# Patient Record
Sex: Female | Born: 1965 | Race: White | Hispanic: No | Marital: Married | Smoking: Former smoker
Health system: Southern US, Community
[De-identification: ages and names within clinical notes are randomized; demographics above are authoritative.]

## PROBLEM LIST (undated history)

## (undated) ENCOUNTER — Emergency Department (HOSPITAL_COMMUNITY): Admission: EM | Payer: Worker's Compensation

## (undated) DIAGNOSIS — E785 Hyperlipidemia, unspecified: Secondary | ICD-10-CM

## (undated) DIAGNOSIS — F419 Anxiety disorder, unspecified: Secondary | ICD-10-CM

## (undated) HISTORY — PX: ABDOMINAL HYSTERECTOMY: SHX81

## (undated) HISTORY — DX: Hyperlipidemia, unspecified: E78.5

## (undated) HISTORY — DX: Anxiety disorder, unspecified: F41.9

---

## 1998-10-11 ENCOUNTER — Emergency Department (HOSPITAL_COMMUNITY): Admission: EM | Admit: 1998-10-11 | Discharge: 1998-10-11 | Payer: Self-pay | Admitting: Emergency Medicine

## 2003-02-13 ENCOUNTER — Emergency Department (HOSPITAL_COMMUNITY): Admission: EM | Admit: 2003-02-13 | Discharge: 2003-02-13 | Payer: Self-pay | Admitting: Emergency Medicine

## 2003-03-08 HISTORY — PX: CARPAL TUNNEL RELEASE: SHX101

## 2003-06-20 ENCOUNTER — Encounter: Admission: RE | Admit: 2003-06-20 | Discharge: 2003-06-20 | Payer: Self-pay | Admitting: Family Medicine

## 2003-07-02 ENCOUNTER — Ambulatory Visit (HOSPITAL_COMMUNITY): Admission: RE | Admit: 2003-07-02 | Discharge: 2003-07-02 | Payer: Self-pay | Admitting: Obstetrics and Gynecology

## 2003-08-06 ENCOUNTER — Encounter (INDEPENDENT_AMBULATORY_CARE_PROVIDER_SITE_OTHER): Payer: Self-pay | Admitting: *Deleted

## 2003-08-07 ENCOUNTER — Inpatient Hospital Stay (HOSPITAL_COMMUNITY): Admission: RE | Admit: 2003-08-07 | Discharge: 2003-08-09 | Payer: Self-pay | Admitting: Obstetrics and Gynecology

## 2006-08-01 ENCOUNTER — Ambulatory Visit: Payer: Self-pay | Admitting: *Deleted

## 2006-08-01 ENCOUNTER — Inpatient Hospital Stay (HOSPITAL_COMMUNITY): Admission: RE | Admit: 2006-08-01 | Discharge: 2006-08-05 | Payer: Self-pay | Admitting: *Deleted

## 2007-08-06 ENCOUNTER — Encounter: Admission: RE | Admit: 2007-08-06 | Discharge: 2007-08-06 | Payer: Self-pay | Admitting: Family Medicine

## 2007-11-29 ENCOUNTER — Ambulatory Visit (HOSPITAL_COMMUNITY): Payer: Self-pay | Admitting: Psychiatry

## 2007-12-05 ENCOUNTER — Ambulatory Visit (HOSPITAL_COMMUNITY): Payer: Self-pay | Admitting: Psychiatry

## 2007-12-06 ENCOUNTER — Ambulatory Visit (HOSPITAL_COMMUNITY): Payer: Self-pay | Admitting: Psychiatry

## 2007-12-18 ENCOUNTER — Ambulatory Visit (HOSPITAL_COMMUNITY): Payer: Self-pay | Admitting: Psychiatry

## 2008-01-01 ENCOUNTER — Ambulatory Visit (HOSPITAL_COMMUNITY): Payer: Self-pay | Admitting: Psychiatry

## 2008-01-08 ENCOUNTER — Ambulatory Visit (HOSPITAL_COMMUNITY): Payer: Self-pay | Admitting: Psychiatry

## 2008-01-29 ENCOUNTER — Ambulatory Visit (HOSPITAL_COMMUNITY): Payer: Self-pay | Admitting: Psychiatry

## 2008-04-01 ENCOUNTER — Ambulatory Visit (HOSPITAL_COMMUNITY): Payer: Self-pay | Admitting: Psychiatry

## 2008-06-24 ENCOUNTER — Ambulatory Visit (HOSPITAL_COMMUNITY): Payer: Self-pay | Admitting: Psychiatry

## 2008-09-23 ENCOUNTER — Ambulatory Visit (HOSPITAL_COMMUNITY): Payer: Self-pay | Admitting: Psychiatry

## 2009-01-27 ENCOUNTER — Ambulatory Visit (HOSPITAL_COMMUNITY): Payer: Self-pay | Admitting: Psychiatry

## 2009-04-21 ENCOUNTER — Ambulatory Visit (HOSPITAL_COMMUNITY): Payer: Self-pay | Admitting: Psychiatry

## 2009-07-14 ENCOUNTER — Ambulatory Visit (HOSPITAL_COMMUNITY): Payer: Self-pay | Admitting: Psychiatry

## 2009-10-13 ENCOUNTER — Ambulatory Visit (HOSPITAL_COMMUNITY): Payer: Self-pay | Admitting: Psychiatry

## 2009-11-17 ENCOUNTER — Ambulatory Visit: Payer: Self-pay | Admitting: Family Medicine

## 2009-11-17 ENCOUNTER — Encounter: Payer: Self-pay | Admitting: Physician Assistant

## 2009-11-17 DIAGNOSIS — F411 Generalized anxiety disorder: Secondary | ICD-10-CM | POA: Insufficient documentation

## 2009-11-17 LAB — CONVERTED CEMR LAB: OCCULT 1: NEGATIVE

## 2009-11-19 LAB — CONVERTED CEMR LAB
ALT: 25 units/L (ref 0–35)
BUN: 9 mg/dL (ref 6–23)
Chloride: 104 meq/L (ref 96–112)
Cholesterol: 241 mg/dL — ABNORMAL HIGH (ref 0–200)
Glucose, Bld: 79 mg/dL (ref 70–99)
MCV: 94.8 fL (ref 78.0–100.0)
Potassium: 4 meq/L (ref 3.5–5.3)
TSH: 0.571 microintl units/mL (ref 0.350–4.500)
Total Bilirubin: 0.2 mg/dL — ABNORMAL LOW (ref 0.3–1.2)
Total CHOL/HDL Ratio: 4.1
Total Protein: 6.8 g/dL (ref 6.0–8.3)

## 2009-11-23 ENCOUNTER — Ambulatory Visit (HOSPITAL_COMMUNITY)
Admission: RE | Admit: 2009-11-23 | Discharge: 2009-11-23 | Payer: Self-pay | Source: Home / Self Care | Admitting: Family Medicine

## 2009-11-25 ENCOUNTER — Encounter: Payer: Self-pay | Admitting: Physician Assistant

## 2009-12-22 ENCOUNTER — Ambulatory Visit (HOSPITAL_COMMUNITY): Payer: Self-pay | Admitting: Psychiatry

## 2010-03-16 ENCOUNTER — Ambulatory Visit (HOSPITAL_COMMUNITY)
Admission: RE | Admit: 2010-03-16 | Discharge: 2010-03-16 | Payer: Self-pay | Source: Home / Self Care | Attending: Psychiatry | Admitting: Psychiatry

## 2010-03-17 ENCOUNTER — Ambulatory Visit (HOSPITAL_COMMUNITY): Admission: RE | Admit: 2010-03-17 | Payer: Self-pay | Source: Home / Self Care | Admitting: Podiatry

## 2010-03-28 ENCOUNTER — Encounter: Payer: Self-pay | Admitting: Family Medicine

## 2010-04-06 NOTE — Letter (Signed)
Summary: [facility]  [facility]   Imported By: Lind Guest 11/25/2009 13:00:47  _____________________________________________________________________  External Attachment:    Type:   Image     Comment:   External Document

## 2010-04-06 NOTE — Assessment & Plan Note (Signed)
Summary: new pt   Vital Signs:  Patient profile:   45 year old female Height:      62 inches Weight:      166.25 pounds BMI:     30.52 O2 Sat:      98 % on Room air Pulse rate:   97 / minute BP sitting:   124 / 72  (right arm)  Nutrition Counseling: Patient's BMI is greater than 25 and therefore counseled on weight management options. CC: here today to establish care with Physician,concerned about weight, she put on about 30 lbs after she quit smoking, she has lost 5 lbs. She also states she has concerns about her big right toe, she can't wear hills. room 1 Is Patient Diabetic? No   CC:  here today to establish care with Physician, concerned about weight, she put on about 30 lbs after she quit smoking, she has lost 5 lbs. She also states she has concerns about her big right toe, and she can't wear hills. room 1.  History of Present Illness: New pt here to establish care with new PCP and needs physical. Last physical 2 yrs ago.  Last mamm 2 yrs ago, and labs also. Last Td 2 yrs ago. Last eye exam 1 yr ago. Dentist care every 6 mos.  See's Behavioral health for anxiety.  Is doing well.   Past History:  Past medical, surgical, family and social histories (including risk factors) reviewed, and no changes noted (except as noted below).  Past Medical History: Anxiety  Past Surgical History: Hysterectomy - due to endometriosis, 3/91 bilat oophorectomy - 2003 Carpal tunnel bilat - 4/05  Family History: Reviewed history and no changes required. Mother - Anxiety Father - DM 1 sister - anxiety 2 brothers - anxiety Mat Grandmother - Breast CA Mat Aunt - Breast CA Engineer, drilling & grandfather - DM  Social History: Reviewed history and no changes required. Occupation:  Production designer, theatre/television/film at [employer] Married Alcohol use-yes Drug use-no Regular exercise-yes, x 1 mos Quit smoking 9/10 Occupation:  employed Drug Use:  no Does Patient Exercise:  yes  Review of Systems General:   Denies chills, fatigue, fever, and loss of appetite. Eyes:  Denies blurring and double vision. ENT:  Denies decreased hearing, earache, nasal congestion, postnasal drainage, ringing in ears, sinus pressure, and sore throat. CV:  Denies chest pain or discomfort and palpitations. Resp:  Denies cough and shortness of breath. GI:  Complains of indigestion; denies abdominal pain, bloody stools, change in bowel habits, constipation, diarrhea, loss of appetite, nausea, and vomiting; INDIGESTION WITH CERTAIN FOODS ONLY. GU:  Denies abnormal vaginal bleeding, dysuria, incontinence, and urinary frequency. MS:  Complains of joint pain; RT 1ST MTP X APPROX 1 YR.. Derm:  Denies rash. Neuro:  Denies headaches, numbness, and tingling. Psych:  Complains of anxiety; denies depression, suicidal thoughts/plans, and thoughts /plans of harming others. Allergy:  Complains of seasonal allergies.  Physical Exam  General:  Well-developed,well-nourished,in no acute distress; alert,appropriate and cooperative throughout examination Head:  Normocephalic and atraumatic without obvious abnormalities. No apparent alopecia or balding. Ears:  External ear exam shows no significant lesions or deformities.  Otoscopic examination reveals clear canals, tympanic membranes are intact bilaterally without bulging, retraction, inflammation or discharge. Hearing is grossly normal bilaterally. Nose:  External nasal examination shows no deformity or inflammation. Nasal mucosa are pink and moist without lesions or exudates. Mouth:  Oral mucosa and oropharynx without lesions or exudates.  Teeth in good repair. Neck:  No deformities,  masses, or tenderness noted. Breasts:  No mass, nodules, thickening, tenderness, bulging, retraction, inflamation, nipple discharge or skin changes noted.   Lungs:  Normal respiratory effort, chest expands symmetrically. Lungs are clear to auscultation, no crackles or wheezes. Heart:  Normal rate and regular  rhythm. S1 and S2 normal without gallop, murmur, click, rub or other extra sounds. Abdomen:  Bowel sounds positive,abdomen soft and non-tender without masses, organomegaly or hernias noted. Rectal:  No external abnormalities noted. Normal sphincter tone. No rectal masses or tenderness. Genitalia:  normal introitus, no external lesions, no vaginal discharge, mucosa pink and moist, and no adnexal masses or tenderness.  Uterus and cervix absent. Pulses:  R posterior tibial normal, R dorsalis pedis normal, L posterior tibial normal, and L dorsalis pedis normal.   Extremities:  No clubbing, cyanosis, edema, or deformity noted with normal full range of motion of all joints.   Neurologic:  alert & oriented X3, sensation intact to light touch, gait normal, and DTRs symmetrical and normal.   Skin:  Intact without suspicious lesions or rashes Cervical Nodes:  No lymphadenopathy noted Axillary Nodes:  No palpable lymphadenopathy Psych:  Cognition and judgment appear intact. Alert and cooperative with normal attention span and concentration. No apparent delusions, illusions, hallucinations   Impression & Recommendations:  Problem # 1:  Preventive Health Care (ICD-V70.0) Assessment Comment Only  Problem # 2:  ANXIETY (ICD-300.00) Assessment: Comment Only  Complete Medication List: 1)  Ativan 0.5mg   .... One tablet as needed 2)  Amitriptylin (elavil) 75mg   .... 1 pill at bedtime 3)  Multi Vitamin  .Marland Kitchen.. 1 pill a day  Other Orders: Mammogram (Screening) (Mammo) Podiatry Referral (Podiatry) T-Comprehensive Metabolic Panel [id number]) T-Lipid Profile [id number]) T-CBC No Diff ([id number]) T-TSH [phone]) Influenza Vaccine NON MCR (02725)  Patient Instructions: 1)  Please schedule a follow-up appointment in 6 months. 2)  You should take a multivit without iron daily.  Such as Centrum silver, or comparable.   3)  You should also get 1200 mg of Calcium a day.  This can be from dietary  intake or calcium supplements. 4)  It is important that you exercise regularly at least 20 minutes 5 times a week. If you develop chest pain, have severe difficulty breathing, or feel very tired , stop exercising immediately and seek medical attention. 5)  You need to lose weight. Consider a lower calorie diet and regular exercise.    Influenza Vaccine    Vaccine Type: Fluvax Non-MCR    Site: left deltoid    Mfr: novartis    Dose: 0.5 ml    Route: IM    Given by: Adella Hare LPN    Exp. Date: 05/12    Lot #: [id number]    VIS given: 09/29/09 version given November 17, 2009.    Orders Added: 1)  Mammogram (Screening) [Mammo] 2)  Podiatry Referral [Podiatry] 3)  T-Comprehensive Metabolic Panel [[id number]] 4)  T-Lipid Profile [[id number]] 5)  T-CBC No Diff [[id number]] 6)  T-TSH [[id number]] 7)  Influenza Vaccine NON MCR [00028]  Laboratory Results    Stool - Occult Blood Hemmoccult #1: negative Date: 11/17/2009

## 2010-07-20 NOTE — H&P (Signed)
NAMEALIYA, SOL NO.:  [id number]   MEDICAL RECORD NO.:  [id number]            PATIENT TYPE:   LOCATION:                                 FACILITY:   PHYSICIAN:  Ashley [name], M.D.           DATE OF BIRTH:   DATE OF ADMISSION:  08/01/2006  DATE OF DISCHARGE:                       PSYCHIATRIC ADMISSION ASSESSMENT   Ashley 45 year old married white [name] voluntarily admitted on Aug 01, 2006.   HISTORY OF PRESENT ILLNESS:  The patient presents with Ashley history of  anxiety.  Has been taking Xanax up to 6 mg Ashley day.  Reports Ashley history of  anxiety and panic.  She provides an example that she has trouble driving  and, because of her anxiety, she always has to drive on the right side  of the road in case she needs to exit.  She reports that she has been  taking more and more Xanax.  She has had significant side effects to  antidepressants in the past.  She reports periods of lost memory.  She  has asked her M.D. for advice and was sent here to be detoxed.  She  denies any suicidal ideation.  She denies any specific stressors.  She  reports she has lost about 40 pounds.   PAST PSYCHIATRIC HISTORY:  First admission to [facility].  She has been on Cymbalta in the past and had suicidal thoughts.   SOCIAL HISTORY:  This is Ashley [name], second marriage,  married for 7 years.  Has two adult children, 42 and 20, lives with her  husband.  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  Prior  to that, worked as Ashley Designer, industrial/product at [employer].   FAMILY HISTORY:  Her mother is currently on Xanax.   ALCOHOL AND DRUG HISTORY:  The patient smokes.  Denies any alcohol or  drug use.   PRIMARY CARE Ashley [name]:  Dr. Marny Lowenstein at [facility].   MEDICAL PROBLEMS:  Denies any acute or chronic health issues.   MEDICATIONS:  Has been on Estrace and Xanax for 10 years.   DRUG ALLERGIES:  No known allergies.   PAST SURGICAL HISTORY:  The  patient had Ashley hysterectomy in 1991 and had  bilateral carpal tunnel surgery in 2004.   REVIEW OF SYSTEMS:  No fever, no chills.  No chest pain, no shortness of  breath.  No nausea, no vomiting.  No dysuria.  Complaining of some left  elbow pain with some numbness.  History of headaches.  No seizures.  Temperature is 97.5, 73 heart rate, 14 respirations, blood pressure  106/62.   PHYSICAL EXAM:  This is Ashley well-developed, well-nourished [name] in no  acute distress.  She is alert and cooperative.  NECK:  No __________  masses.  Negative lymphadenopathy.  Trachea is midline.  No thyroid  enlargement.  CHEST:  Is clear, no crackles, no wheezes.  BREAST EXAM:  Deferred.  HEART:  Regular rate and rhythm, S1 and S2 auscultated.  No murmurs, no  gallops, no rubs.  ABDOMEN:  Is soft, no tenderness to palpation.  Bowel  sounds auscultated x4.  GU EXAM:  Deferred.  EXTREMITIES:  The patient moves all extremities, no edema, no clubbing.  Full range of motion, 5+ against resistance.  Peripheral pulses intact.  SKIN:  No signs of self injury, no rashes, no bruising.  NEUROLOGICAL FINDINGS:  The patient is alert and oriented.  Cranial  nerves are intact.  Sensation intact.  Negative Romberg.  Normal gait.  Reflexes intact.  Performs rapid alternating movements without  difficulty.  Urine drug screen is positive for benzodiazepines.  The  CMET was within normal limits.  Urinalysis is negative.  The TSH is  0.928, RBC is 3.85, RDW is 15.3.   MENTAL STATUS EXAM:  She is Ashley fully alert, cooperative [name].  Speech  is clear, normal rate and tone.  The patient's mood is somewhat anxious.  The patient is friendly, pleasant.  Thought processes are coherent.  No  evidence of psychosis, cognition and function intact.  Memory is good.  Judgment and insight are good.   AXIS I:  Anxiety disorder, benzo dependence.  AXIS II:  Deferred.  AXIS III:  No acute or chronic health issues.  Does report Ashley history  of  headaches.  AXIS IV:  Is deferred.  AXIS V:  Current is 45.   PLANS:  To contract for safety.  Stabilize mood and thinking.  We  discussed detox protocol with Librium.  The patient was agreeable to  discontinuing her Xanax and start the Librium.  Discussed Ashley family  session with her husband for support.  The patient does report the  patient's husband is very supportive.  The patient to follow up with Dr.  Dorothe Pea as needed and will discuss any other medications to help with  complaints of anxiety.  Her tentative length of stay is 4-5 days.      Ashley [name], N.P.      Ashley [name], M.D.  Electronically Signed    JO/MEDQ  D:  08/04/2006  T:  08/04/2006  Job:  [id number]

## 2010-07-23 NOTE — Op Note (Signed)
NAME:  Ashley [name], Ashley [name]                            ACCOUNT NO.:  [id number]   MEDICAL RECORD NO.:  [id number]                   PATIENT TYPE:  INP   LOCATION:  9306                                 FACILITY:  [facility]   PHYSICIAN:  Rudy Jew. Ashley Royalty, M.D.             DATE OF BIRTH:  04-24-1965   DATE OF PROCEDURE:  08/06/2003  DATE OF DISCHARGE:  08/09/2003                                 OPERATIVE REPORT   PREOPERATIVE DIAGNOSES:  1. Status post hysterectomy.  2. Pelvic pain -- debilitating.   POSTOPERATIVE DIAGNOSES:  1. Left ovarian cyst.  2. Pelvic adhesions.   PROCEDURE:  1. Diagnostic laparoscopy.  2. Exploratory laparotomy with lysis of adhesions.  3. Bilateral salpingo-oophorectomy.   SURGEON:  Rudy Jew. Ashley Royalty, M.D.   ANESTHESIA:  General.   ESTIMATED BLOOD LOSS:  Less than 50 cc.   COMPLICATIONS:  None.   PACKS AND DRAINS:  None.   DESCRIPTION OF PROCEDURE:  The patient was taken to the operating room and  placed in the dorsal supine position.  After adequate general anesthesia was  administered, she was placed in the lithotomy position and prepped and  draped in the usual manner for abdominal and vaginal surgery.  A Foley  catheter was placed.  A 1.5 cm longitudinal incision was made in the  inferior aspect of the umbilicus.  Then size 10-11 disposable laparoscopic  trocar was placed into the abdominal cavity; the location was verified by  placement of the laparoscope.  There was no evidence of any trauma.  CO2 was  used to create a pneumoperitoneum, which was maintained throughout the  procedure.   Next, a 5 mm suprapubic trocar was placed in the left lower quadrant.  Transillumination and direct visualization techniques were employed.  At  this point the patient was placed in Trendelenburg and an attempt was made  to visualize the pelvis.  The uterus was surgically absent.  The right ovary  was stuck to the pelvic sidewall.  It appeared to be otherwise normal  size,  shape and contour.  The left ovary was not well visualized due to colonic  adhesions, which obscured adequate visualization.   The patient stated an adamant desire preoperatively for bilateral salpingo-  oophorectomy.  Due to the right ovary being bound to the pelvic sidewall and  the left ovary obscured by adhesions, it was obvious the procedure would not  be feasible laparoscopically; hence, the decision was made to abandon  laparoscopy and proceed with laparotomy.   The abdominal instruments were removed and pneumoperitoneum evacuated.  The  skin on the left suprapubic was closed with 3-0 Monocryl in a subcuticular  fashion.  Thereafter the operator changed gloves and proceeded with  laparotomy.  The umbilical incision was simply extended inferiorly to create  the laparotomy incision.  The subcutaneous tissues were sharply dissected  down to the fascia, which was nicked with the  knife and incised  longitudinally with Mayo scissors.  The midline was identified and the  rectus muscles separated in the midline, exposing the peritoneum which was  elevated with hemostats and entered atraumatically with  Metzenbaum  scissors.  The laparoscopy incision was incorporated into the laparotomy  incision.   At this point the previously mentioned adhesions were encountered, and using  meticulous dissection and careful attention to avoid any injury to the GI  tract, the adhesions were taken down and the pelvis brought into view.  A  Balfour retractor was placed.  The upper abdomen was then packed off.  Using  sharp and blunt dissection, the ovaries were released from the pelvic  sidewall.  The remainder of the peritoneal surfaces were smooth and  glistening.  The cul-de-sac was clean.  The left ovary contained a 2-3 cm  cyst.  The right ovary appeared to be normal size, shape and contour (as  mentioned above).   The left broad ligament was opened.  The peritoneum was extended superiorly   above the left ovary.  The ureter was noted to be below the plain of  dissection on the medial leaf of the broad ligament.  The infundibulopelvic  ligament was isolated, clamped, cut and secured with #1 chromic catgut.  The  left ovary and tube were then excised and submitted to the pathology for  histologic studies.   The right broad ligament was opened.  The visceroperitoneum was opened  superiorly to the right ovary.  The ureter was identified well below the  plain of dissection on the medial leaf of the broad ligament.  The right  infundibulopelvic ligament was isolated, clamped, cut and secured with #1  chromic catgut.  The right fallopian tube and ovary were then excised and  submitted to pathology for histologic studies.   Hemostasis was noted.  The appendix was seen and felt to be normal.  At this  point the patient was felt to have benefited maximally from the surgical  procedure.  The abdominal  packs were removed.  The Balfour retractor was  removed.  The incision was then closed with a #1 PDS, using a modified Smead-  Jones technique.  The skin was closed with staples.   At the conclusion of the procedure, the urine was clear and copious.  The  patient was then taken to the recovery room in excellent condition.                                               James A. Ashley Royalty, M.D.    JAM/MEDQ  D:  08/10/2003  T:  08/11/2003  Job:  [id number]

## 2010-07-23 NOTE — H&P (Signed)
NAME:  Ashley [name], Ashley [name]                            ACCOUNT NO.:  [id number]   MEDICAL RECORD NO.:  [id number]                   PATIENT TYPE:  AMB   LOCATION:  [facility]                                  FACILITY:  [facility]   PHYSICIAN:  James A. Ashley Royalty, M.D.             DATE OF BIRTH:  02/07/1966   DATE OF ADMISSION:  08/06/2003  DATE OF DISCHARGE:                                HISTORY & PHYSICAL   This is a 45 year old gravida 2, para 2 referred to me for evaluation of  pelvic discomfort and recently diagnosed ovarian cysts.  I saw the patient  for the first time June 25, 2003, with the aforementioned complaints.  An  ultrasound was ordered by her primary care physician which revealed a  hemorrhagic right ovarian cyst and a small ovarian cyst on the left side.  She is status post hysterectomy.  Subsequently, a CT scan was obtained of  the abdomen and pelvis.  It returned as negative for any masses.  The  patient continued to have quite debilitating pelvic pain, particularly on  the right side.  She requested surgical intervention.  We discussed the  availability of laparoscopy and the patient requested same.  She returned  Aug 01, 2003, for preop consultation, at that time stated her pain was still  a 6 on a scale from 1 to 10.  She specifically requested removal of both  ovaries and fallopian tubes.   MEDICATIONS:  Xanax, Lexapro, oxycodone, buspirone.   PAST MEDICAL HISTORY:  Medical anxiety.   SURGICAL:  1. Ortho - carpal tunnel.  2. Hysterectomy March, 1991.   ALLERGIES:  NONE.   FAMILY HISTORY:  Is positive for hypertension, diabetes, breast cancer and  ovarian cancer.   SOCIAL HISTORY:  Patient is a smoker.  She also drinks alcohol.   REVIEW OF SYSTEMS:  Noncontributory.   PHYSICAL EXAMINATION:  A well-developed, well-nourished, pleasant female in  no acute distress.  Afebrile.  VITAL SIGNS:  Stable.  SKIN:  Warm and dry without lesions.  LYMPH:  There is no supraclavicular,  cervical or inguinal adenopathy.  HEENT:  Normocephalic.  NECK:  Supple without thyromegaly.  CHEST/LUNGS:  Clear.  CARDIAC:  Regular rate and rhythm without murmurs, gallops or rubs.  BREAST EXAM:  Deferred.  ABDOMEN:  Soft and nontender without masses or organomegaly.  Bowel sounds  were active.  MUSCULOSKELETAL EXAMINATION:  Reveals full range of motion without edema,  cyanosis or CVA tenderness.  PELVIC EXAMINATION (Aug 01, 2003):  __________ within normal limits.  Vagina  was without gross lesions.  Vaginal cuff was well healed.  Bimanual  examination reveals surgical absence of the uterus.  I appreciate no adnexal  masses.   IMPRESSION:  1. Status post hysterectomy.  2. Pelvic pain - debilitating - differential includes adhesions,     endometriosis.   PLAN:  Diagnostic/operative laparoscopy with bilateral salpingo-  oophorectomy.  Risks, benefits, complications and alternatives were fully  discussed with the patient.  She understands __________ bilateral salpingo-  oophorectomy, she would become a candidate for hormone replacement therapy.  She also understands the possibility of an exploratory laparotomy and agrees  to same.  Questions invited and answered.                                               James A. Ashley Royalty, M.D.    JAM/MEDQ  D:  08/06/2003  T:  08/06/2003  Job:  [id number]

## 2010-07-23 NOTE — Discharge Summary (Signed)
NAMECASSIDI, MODESITT NO.:  [id number]   MEDICAL RECORD NO.:  [id number]          PATIENT TYPE:  IPS   LOCATION:  0303                          FACILITY:  [facility]   PHYSICIAN:  Geoffery Lyons, M.D.      DATE OF BIRTH:  1965/05/24   DATE OF ADMISSION:  08/01/2006  DATE OF DISCHARGE:  08/05/2006                               DISCHARGE SUMMARY   BRIEF HISTORY AND PHYSICAL:  This was the first admission to [facility] for this 45 year old married white female voluntarily  admitted.  History of anxiety on increased amount of Xanax of up to 6 mg  per day, panic, trouble driving.  Has to drive on the right side in case  she needs to exit.  Taking more and more Xanax.  Claims side effects to  antidepressants, had periods of lost memory.   PAST PSYCHIATRIC HISTORY:  First time at Behavior Health.  In the past  has been on Cymbalta with previous some suicidal thoughts.  Had been on  Lexapro and other antidepressants.   SOCIAL HISTORY:  No active alcohol or any other substance use.   MEDICAL HISTORY:  Noncontributory.   MEDICATIONS:  Estrace, Xanax for 10 years.   PHYSICAL EXAMINATION:  Performed and failed to show any acute findings   LABORATORY WORK:  Not available in the chart.   MENTAL STATUS EXAM:  Reveals a fully alert, cooperative female.  Mood  anxious.  Affect anxious.  Speech clear, normal rate, tempo and  production.  Thought processes logical, coherent and relevant.  Endorsed  no active suicidal or homicidal ideations.  No evidence of delusions.  Cognition well-preserved.   DIAGNOSES:  AXIS I:  Anxiety disorder, not otherwise specified.  Benzodiazepine dependence.  AXIS II:  No diagnosis.  AXIS III:  No diagnosis.  AXIS IV:  Moderate.  AXIS V:  GAF on admission 45, GAF highest in the last year 70.   COURSE IN THE HOSPITAL:  She was admitted.  She was started on  individual and group psychotherapy.  As already stated this 41-year  female  has a history of depression, anxiety.  Has been on  antidepressants at the present with poor results.  She is taking Xanax  for anxiety for the past 10 years.  She developed suicidal ideas,  flushed her medications and has had no Xanax for 3 days.  She went to  [facility] for a 3-day medical admit, was not detoxed.  She was  discharged on Xanax.  As she opened up she endorsed that she had been  basically masking the feelings that she had been having.  Endorsed that  she had felt guilty when she left the husband when she left him due to  the affect that it could have on the children who were in junior and  senior high school.  She says that the children are doing very well and  she has no reason why to believe that they were affected but she had  been holding to this for all these years.  She endorsed  that she had  been helped to challenge her perception of things. She has coped through  the years by increasing the amount of Xanax, initially prescribed up to  6 mg a day.  She has been using more than 10.  She quit on her own, had  a seizure on the third day.  Placed back on Xanax. Family session  scheduled for May 31.  On May 30 she continued to detox, was able to  talk to her children and says that they could not validate her concerns,  her guilt for doing what she did by leaving the husband.  The children  validated that they never felt like the way she imagined that they felt,  that they are both doing really well.  We pursued the detox.  We pursued  ways of dealing with the anxiety.  The family session with the husband  went well.  There was __________ support.  The husband endorsed that it  was the best he has seen her for all this year.  There were no suicidal  thoughts.  She was in full contact with reality, no active withdrawal.  Therefore, we went ahead and discharged to outpatient follow-up.   DISCHARGE DIAGNOSES:  AXIS I:  Anxiety disorder, not otherwise  specified.   Benzodiazepine dependence.  AXIS II:  No diagnosis.  AXIS III:  No diagnosis.  AXIS IV:  Moderate.  AXIS V:  GAF upon discharge 55-60.   DISCHARGE MEDICATIONS:  Discharged on Estrace, no psychotropics.   FOLLOW UP:  Follow up with Fransisco Hertz.           ______________________________  Geoffery Lyons, M.D.     IL/MEDQ  D:  08/23/2006  T:  08/24/2006  Job:  [id number]

## 2010-07-23 NOTE — Discharge Summary (Signed)
NAME:  Ashley [name], Ashley [name]                            ACCOUNT NO.:  [id number]   MEDICAL RECORD NO.:  [id number]                   PATIENT TYPE:  INP   LOCATION:  9306                                 FACILITY:  [facility]   PHYSICIAN:  Rudy Jew. Ashley Royalty, M.D.             DATE OF BIRTH:  Feb 11, 1966   DATE OF ADMISSION:  08/06/2003  DATE OF DISCHARGE:  08/09/2003                                 DISCHARGE SUMMARY   DISCHARGE DIAGNOSES:  1. Status post total abdominal hysterectomy.  2. Pelvic pain.  3. Adhesions.     a. Omentum-right pelvic sidewall.     b. Colonic-colonic.     c. Colonic-left pelvic sidewall and left ovary.   OPERATIONS AND SPECIAL PROCEDURES:  1. Diagnostic laparoscopy.  2. Exploratory laparotomy with bilateral salpingo-oophorectomy and lysis of     adhesions.   CONSULTATIONS:  None.   DISCHARGE MEDICATIONS:  1. Estrace 1 mg daily.  2. Percocet.   HISTORY AND PHYSICAL:  This is a 45 year old gravida 2 para 2 referred to me  for evaluation of pelvic discomfort and recently diagnosed ovarian cyst.  An  ultrasound was ordered by her primary care physician which revealed a  hemorrhagic right ovarian cyst and a small ovarian cyst on the left side.  Subsequently a CT scan was obtained of the abdomen and pelvis.  It returned  as negative for any masses.  The patient continued to have quite  debilitating pelvic discomfort, particularly on the right side.  She  requested surgical intervention and, specifically, a bilateral salpingo-  oophorectomy.  For the remainder of the history and physical please see  chart.   HOSPITAL COURSE:  The patient was brought to the hospital August 06, 2003 for a  diagnostic laparoscopy.  She was taken to the operating room on that day and  diagnostic laparoscopy was performed.  During the procedure it was noted  that the right ovary was stuck to the pelvic sidewall.  The left ovary was  not well visualized due to colonic adhesions.  It was quickly  obvious to the  operator that the procedure could not be satisfactorily performed  laparoscopically.  Hence, the decision was made to convert to laparotomy.  This was accomplished without difficulty.  Lysis of adhesions was  accomplished.  Finally, the ovaries were both visualized and a bilateral  salpingo-oophorectomy was successfully performed.  The procedure was  uncomplicated.  The patient's postoperative course was complicated by  transient hypotension which was asymptomatic.  Urine culture was obtained.  The remainder of the postoperative course was benign.  The patient was  discharged home on August 09, 2003 afebrile and in satisfactory condition.   ACCESSORY CLINICAL FINDINGS:  Hemoglobin and hematocrit on admission were  11.4 and 34.4. respectively.  Repeat values obtained August 07, 2003 were 10.7  and 31.8 respectively.  White blood count was normal throughout.  Urine  culture revealed  no growth as of the time of this dictation.   DISPOSITION:  The patient is to return to [facility]  in 4-6 weeks for postoperative evaluation.  She is also to return August 13, 2003 to have the remainder of the surgical clips removed.                                               James A. Ashley Royalty, M.D.    JAM/MEDQ  D:  08/27/2003  T:  08/29/2003  Job:  [id number]

## 2010-09-07 ENCOUNTER — Encounter (HOSPITAL_COMMUNITY): Payer: Self-pay | Admitting: Psychiatry

## 2010-11-26 ENCOUNTER — Ambulatory Visit (INDEPENDENT_AMBULATORY_CARE_PROVIDER_SITE_OTHER): Payer: BC Managed Care – PPO | Admitting: Family Medicine

## 2010-11-26 ENCOUNTER — Encounter: Payer: Self-pay | Admitting: Family Medicine

## 2010-11-26 VITALS — BP 102/74 | HR 83 | Resp 16 | Ht 62.0 in | Wt 174.4 lb

## 2010-11-26 DIAGNOSIS — Z23 Encounter for immunization: Secondary | ICD-10-CM

## 2010-11-26 DIAGNOSIS — Z0000 Encounter for general adult medical examination without abnormal findings: Secondary | ICD-10-CM

## 2010-11-26 DIAGNOSIS — E669 Obesity, unspecified: Secondary | ICD-10-CM

## 2010-11-26 DIAGNOSIS — F411 Generalized anxiety disorder: Secondary | ICD-10-CM

## 2010-11-26 DIAGNOSIS — Z1231 Encounter for screening mammogram for malignant neoplasm of breast: Secondary | ICD-10-CM

## 2010-11-26 LAB — BASIC METABOLIC PANEL
Creat: 0.8 mg/dL (ref 0.50–1.10)
Sodium: 140 mEq/L (ref 135–145)

## 2010-11-26 LAB — LIPID PANEL
Cholesterol: 249 mg/dL — ABNORMAL HIGH (ref 0–200)
Total CHOL/HDL Ratio: 3.5 Ratio
Triglycerides: 88 mg/dL (ref <150)

## 2010-11-26 LAB — TSH: TSH: 1.1 u[IU]/mL (ref 0.350–4.500)

## 2010-11-26 MED ORDER — LORAZEPAM 0.5 MG PO TABS: 0.5000 mg | ORAL_TABLET | ORAL | Status: DC | PRN

## 2010-11-26 MED ORDER — AMITRIPTYLINE HCL 75 MG PO TABS: 75.0000 mg | ORAL_TABLET | Freq: Every day | ORAL | Status: DC

## 2010-11-26 MED ORDER — INFLUENZA VAC TYPES A & B PF IM SUSP: 0.5000 mL | Freq: Once | INTRAMUSCULAR | Status: DC

## 2010-11-26 NOTE — Patient Instructions (Addendum)
Next physical in 1 year We will call with lab results Your medications have been refilled I recommend a yearly eye visit I recommend Dental visit every 6 months Mammogram to be scheduled Flu shot and Tetanus booster given today F/u in 6 months for medications

## 2010-11-26 NOTE — Progress Notes (Signed)
  Subjective:    Patient ID: Ashley [name], female    DOB: [birth year], 45 y.o.   MRN: [id number]  HPI   Pt here to establish care, needs refills on medications, labwork done for physical  Occ aches and pains- takes Joint Juice w/ glucosamine  Anxiety- Takes Elavil and Lorazepam (30 tablets every 90 days- to use prn ), previously followed by Dr. Lolly Mustache at behavioral health, but has been stable for more than 1 year on same medications. He recently released her. She works for [employer], enjoys her job, does not feel overly anxious or stressed at work. No recent hospitalizations for anxiety  Quit tobacco 2 years ago  Needs TDAP, Flu  Diet- Weight watchers, started 2 weeks ago, 3x a week exercises up to 1.75miles - 1 pound, heavist weight in past 216, lowest 130lbs Due for Mammogram No early colon cancer history    Review of Systems   GEN- denies fatigue, fever, weight loss,weakness, recent illness HEENT- denies eye drainage, change in vision,  CVS- denies chest pain, palpitations RESP- denies SOB, cough, wheeze ABD- denies N/V, change in stools, abd pain GU- denies  dribbling, incontinence MSK- occ joint pain, denies muscle aches, injury Neuro- denies headache, dizziness, syncope, seizure activity      Objective:   Physical Exam GEN- NAD, alert and oriented x3, overweight, very pleseant HEENT- PERRL, EOMI, , MMM, oropharynx clear, fundoscopic exam benign Neck- Supple, no thryomegaly CVS- RRR, no murmur RESP-CTAB EXT- No edema Pulses- Radial, DP- 2+ Psych- normal affect, not depressed or anxious appearing, normal speech        Assessment & Plan:

## 2010-11-28 ENCOUNTER — Encounter: Payer: Self-pay | Admitting: Family Medicine

## 2010-11-28 NOTE — Assessment & Plan Note (Signed)
Discussed weight loss, pt has recently started weight watchers which I encouraged

## 2010-11-28 NOTE — Assessment & Plan Note (Signed)
Overall appears to be doing well Will schedule for Mammogram Discussed with pt even though she has had hysterectomy, should have vaginal cuff checked every 2-3 years- she had this done last year. FLP TDAP and Flu given

## 2010-11-28 NOTE — Assessment & Plan Note (Signed)
Pt appears stable, will get records Refill given on meds- given 30 tabs of xanax which should last until Dec

## 2010-11-30 ENCOUNTER — Telehealth: Payer: Self-pay | Admitting: *Deleted

## 2010-11-30 NOTE — Telephone Encounter (Signed)
Patient aware of lab results.

## 2010-11-30 NOTE — Telephone Encounter (Signed)
Message copied by Diamantina Monks on Tue Nov 30, 2010  2:15 PM ------      Message from: Milinda Antis F      Created: Sun Nov 28, 2010 11:21 AM       Please let pt know her cholesterol was elevated. Her total cholesterol was 249, normal , is < 200. Her bad cholesterol was 160 she should be < 140.      I want her to stick to the weight watchers diet and monitor her fats, avoid fried foods and lots of carbs and start walking some.      We will recheck her progress at our next visit in 6 months.      No medications are needed.      Her other labs- thyroid, kidney,electroylytes, screening glucose were normal.

## 2011-02-19 ENCOUNTER — Other Ambulatory Visit (HOSPITAL_COMMUNITY): Payer: Self-pay | Admitting: Psychiatry

## 2011-02-24 ENCOUNTER — Emergency Department (HOSPITAL_COMMUNITY)
Admission: EM | Admit: 2011-02-24 | Discharge: 2011-02-24 | Disposition: A | Discharge status: Home / Self Care | Payer: Worker's Compensation | Attending: Emergency Medicine | Admitting: Emergency Medicine

## 2011-02-24 ENCOUNTER — Emergency Department (HOSPITAL_COMMUNITY): Payer: Worker's Compensation

## 2011-02-24 ENCOUNTER — Encounter (HOSPITAL_COMMUNITY): Payer: Self-pay | Admitting: *Deleted

## 2011-02-24 DIAGNOSIS — Y9269 Other specified industrial and construction area as the place of occurrence of the external cause: Secondary | ICD-10-CM | POA: Insufficient documentation

## 2011-02-24 DIAGNOSIS — Z9079 Acquired absence of other genital organ(s): Secondary | ICD-10-CM | POA: Insufficient documentation

## 2011-02-24 DIAGNOSIS — Z87891 Personal history of nicotine dependence: Secondary | ICD-10-CM | POA: Insufficient documentation

## 2011-02-24 DIAGNOSIS — F411 Generalized anxiety disorder: Secondary | ICD-10-CM | POA: Insufficient documentation

## 2011-02-24 DIAGNOSIS — S63602A Unspecified sprain of left thumb, initial encounter: Secondary | ICD-10-CM

## 2011-02-24 DIAGNOSIS — S6390XA Sprain of unspecified part of unspecified wrist and hand, initial encounter: Secondary | ICD-10-CM | POA: Insufficient documentation

## 2011-02-24 DIAGNOSIS — W010XXA Fall on same level from slipping, tripping and stumbling without subsequent striking against object, initial encounter: Secondary | ICD-10-CM | POA: Insufficient documentation

## 2011-02-24 NOTE — ED Provider Notes (Signed)
History     CSN: [id number]  Arrival date & time 02/24/11  2151   First MD Initiated Contact with Patient 02/24/11 2203      Chief Complaint  Patient presents with  . Fall    (Consider location/radiation/quality/duration/timing/severity/associated sxs/prior treatment) HPI Comments: Denies any other injuries.  Took ibuprofen 400 mg ~ 1530, 30 min after the fall.  Patient is a 45 y.o. female presenting with fall. The history is provided by the patient. No language interpreter was used.  Fall The accident occurred 6 to 12 hours ago. The fall occurred while standing. She landed on a hard floor. Point of impact: L hand. Pain location: L hand, especially the thumb. The pain is moderate. She was ambulatory at the scene. There was no entrapment after the fall. There was no drug use involved in the accident. There was no alcohol use involved in the accident. Exacerbated by: L thumb movement and palpation. She has tried NSAIDs for the symptoms. The treatment provided mild relief.    Past Medical History  Diagnosis Date  . Anxiety     Past Surgical History  Procedure Date  . Abdominal hysterectomy   . Carpal tunnel release 2005    bilateral    Family History  Problem Relation Age of Onset  . Diabetes Father     History  Substance Use Topics  . Smoking status: Former Games developer  . Smokeless tobacco: Not on file  . Alcohol Use: Yes    OB History    Grav Para Term Preterm Abortions TAB SAB Ect Mult Living                  Review of Systems  Musculoskeletal:       Thumb injury   All other systems reviewed and are negative.    Allergies  Review of patient's allergies indicates no known allergies.  Home Medications   Current Outpatient Rx  Name Route Sig Dispense Refill  . AMITRIPTYLINE HCL 75 MG PO TABS Oral Take 1 tablet (75 mg total) by mouth at bedtime. 30 tablet 6  . CALCIUM-VITAMIN D 600-200 MG-UNIT PO TABS Oral Take 1 tablet by mouth 2 (two) times daily.      .  IBUPROFEN 200 MG PO TABS Oral Take 400 mg by mouth once as needed. For pain     . ONE-DAILY MULTI VITAMINS PO TABS Oral Take 1 tablet by mouth daily.      Marland Kitchen FISH OIL 1000 MG PO CAPS Oral Take by mouth daily.      Marland Kitchen LORAZEPAM 0.5 MG PO TABS Oral Take 0.5 mg by mouth as needed. For anxiety       BP 120/64  Pulse 84  Temp(Src) 97.7 F (36.5 C) (Oral)  Wt 168 lb (76.204 kg)  SpO2 99%  Physical Exam  Nursing note and vitals reviewed. Constitutional: She is oriented to person, place, and time. She appears well-developed and well-nourished. No distress.  HENT:  Head: Normocephalic and atraumatic.  Eyes: EOM are normal.  Neck: Normal range of motion.  Cardiovascular: Normal rate, regular rhythm and normal heart sounds.   Pulmonary/Chest: Effort normal and breath sounds normal.  Abdominal: Soft. She exhibits no distension. There is no tenderness.  Musculoskeletal: She exhibits tenderness.       Right hand: She exhibits decreased range of motion, tenderness, bony tenderness and swelling. She exhibits normal capillary refill, no deformity and no laceration. normal sensation noted. Decreased strength noted. She exhibits thumb/finger opposition.  Hands: Neurological: She is alert and oriented to person, place, and time.  Skin: Skin is warm and dry.  Psychiatric: She has a normal mood and affect. Judgment normal.    ED Course  Procedures (including critical care time)  Results for orders placed in visit on 11/26/10  LIPID PANEL      Component Value Range   Cholesterol 249 (*) 0 - 200 (mg/dL)   Triglycerides 88  <284 (mg/dL)   HDL 71  >13 (mg/dL)   Total CHOL/HDL Ratio 3.5     VLDL 18  0 - 40 (mg/dL)   LDL Cholesterol 244 (*) 0 - 99 (mg/dL)  BASIC METABOLIC PANEL      Component Value Range   Sodium 140  135 - 145 (mEq/L)   Potassium 4.5  3.5 - 5.3 (mEq/L)   Chloride 102  96 - 112 (mEq/L)   CO2 26  19 - 32 (mEq/L)   Glucose, Bld 97  70 - 99 (mg/dL)   BUN 14  6 - 23 (mg/dL)    Creat 0.10  2.72 - 1.10 (mg/dL)   Calcium 9.5  8.4 - 53.6 (mg/dL)  TSH      Component Value Range   TSH 1.100  0.350 - 4.500 (uIU/mL)   Dg Hand Complete Left  02/24/2011  *RADIOLOGY REPORT*  Clinical Data: Left thumb pain status post fall.  LEFT HAND - COMPLETE 3+ VIEW  Comparison: None.  Findings: No displaced acute fracture or dislocation identified. No aggressive appearing osseous lesion.  IMPRESSION: No acute osseous abnormality. If clinical concern for a fracture persists, recommend a repeat radiograph in 5-10 days to evaluate for interval change or callus formation.  Original Report Authenticated By: Waneta Martins, M.D.       1. Sprain of left thumb     Medical screening examination/treatment/procedure(s) were performed by non-physician practitioner and as supervising physician I was immediately available for consultation/collaboration. Osvaldo Human, M.D.        Worthy Rancher, PA 02/24/11 2259  Carleene Cooper III, MD 02/25/11 [phone]

## 2011-02-24 NOTE — ED Notes (Signed)
Pt reports while at work today she tripped over some equipment and fell forward. "I went to catch myself and when I did my left thumb bent back and popped." Pt has full ROM to wrist and fingers but reports a "sharp" pain to thumb with movement. Pt has ace wrap in place to left hand and wrist.

## 2011-02-24 NOTE — ED Notes (Signed)
Tripped and fell at work.  Pain lt wrist and thumb

## 2011-12-05 ENCOUNTER — Encounter: Payer: Self-pay | Admitting: Family Medicine

## 2011-12-05 ENCOUNTER — Other Ambulatory Visit (HOSPITAL_COMMUNITY)
Admission: RE | Admit: 2011-12-05 | Discharge: 2011-12-05 | Disposition: A | Discharge status: Home / Self Care | Payer: BC Managed Care – PPO | Source: Ambulatory Visit | Attending: Family Medicine | Admitting: Family Medicine

## 2011-12-05 ENCOUNTER — Ambulatory Visit (INDEPENDENT_AMBULATORY_CARE_PROVIDER_SITE_OTHER): Payer: BC Managed Care – PPO | Admitting: Family Medicine

## 2011-12-05 VITALS — BP 118/74 | HR 60 | Resp 15 | Ht 62.0 in | Wt 149.0 lb

## 2011-12-05 DIAGNOSIS — Z23 Encounter for immunization: Secondary | ICD-10-CM

## 2011-12-05 DIAGNOSIS — E785 Hyperlipidemia, unspecified: Secondary | ICD-10-CM

## 2011-12-05 DIAGNOSIS — F411 Generalized anxiety disorder: Secondary | ICD-10-CM

## 2011-12-05 DIAGNOSIS — Z1211 Encounter for screening for malignant neoplasm of colon: Secondary | ICD-10-CM

## 2011-12-05 DIAGNOSIS — Z1239 Encounter for other screening for malignant neoplasm of breast: Secondary | ICD-10-CM

## 2011-12-05 DIAGNOSIS — Z0000 Encounter for general adult medical examination without abnormal findings: Secondary | ICD-10-CM

## 2011-12-05 DIAGNOSIS — Z01419 Encounter for gynecological examination (general) (routine) without abnormal findings: Secondary | ICD-10-CM | POA: Insufficient documentation

## 2011-12-05 LAB — COMPREHENSIVE METABOLIC PANEL
BUN: 11 mg/dL (ref 6–23)
Chloride: 104 mEq/L (ref 96–112)
Creat: 0.83 mg/dL (ref 0.50–1.10)
Potassium: 5 mEq/L (ref 3.5–5.3)
Sodium: 141 mEq/L (ref 135–145)
Total Protein: 7.4 g/dL (ref 6.0–8.3)

## 2011-12-05 LAB — CBC
HCT: 39.8 % (ref 36.0–46.0)
MCH: 33.3 pg (ref 26.0–34.0)
MCHC: 34.9 g/dL (ref 30.0–36.0)
MCV: 95.2 fL (ref 78.0–100.0)
Platelets: 270 10*3/uL (ref 150–400)
RBC: 4.18 MIL/uL (ref 3.87–5.11)

## 2011-12-05 LAB — LIPID PANEL
Cholesterol: 248 mg/dL — ABNORMAL HIGH (ref 0–200)
HDL: 54 mg/dL (ref >39)
LDL Cholesterol: 133 mg/dL — ABNORMAL HIGH (ref 0–99)
Total CHOL/HDL Ratio: 4.6 Ratio

## 2011-12-05 LAB — TSH: TSH: 1.151 u[IU]/mL (ref 0.350–4.500)

## 2011-12-05 NOTE — Patient Instructions (Addendum)
I recommend eye visit once a year I recommend dental visit every 6 months Goal is to  Exercise 30 minutes 5 days a week We will send a letter with lab results from today Schedule mammogram F/U 1 year for physical

## 2011-12-05 NOTE — Assessment & Plan Note (Signed)
Doing well off medications. 

## 2011-12-05 NOTE — Progress Notes (Signed)
  Subjective:    Patient ID: Ashley [name], female    DOB: 07-28-1965, 46 y.o.   MRN: [id number]  HPI  PT here for CPE, no concerns, doing well Tapered herself off anxiety medications > 6 months ago. Sleeping better Taking vitamins only  Due for Mammogram  Review of Systems  GEN- denies fatigue, fever, weight loss,weakness, recent illness HEENT- denies eye drainage, change in vision, nasal discharge, CVS- denies chest pain, palpitations RESP- denies SOB, cough, wheeze ABD- denies N/V, change in stools, abd pain GU- denies dysuria, hematuria, dribbling, incontinence MSK- denies joint pain, muscle aches, injury Neuro- denies headache, dizziness, syncope, seizure activity      Objective:   Physical Exam GEN- NAD, alert and oriented x3 HEENT- PERRL, EOMI, non injected sclera, pink conjunctiva, MMM, oropharynx clear, TM clear bilat Neck- Supple, no thyromegaly Breast- normal symmetry, no nipple inversion,no nipple drainage, no nodules or lumps [name] Nodes- no axillary nodes CVS- RRR, no murmur RESP-CTAB ABD-NABS,soft,NT,ND GU- normal external genitalia, vaginal mucosa pink and moist, s/p hysterctomy, vaginal cuff pink, no lesions, normal urethra, no bladder prolpase Rectal- normal tone, FOBT neg, EXT- No edema Pulses- Radial, DP- 2+ Psych-normal affect and mood         Assessment & Plan:

## 2011-12-05 NOTE — Assessment & Plan Note (Signed)
Check FLP, anticipate this will be much better with her weight loss and change in diet

## 2011-12-05 NOTE — Assessment & Plan Note (Signed)
Doing well, PAP Smear done on vaginal cuff, history of endometriosis. Mammogram to be scheduled Fasting labs today

## 2011-12-06 ENCOUNTER — Ambulatory Visit (HOSPITAL_COMMUNITY)
Admission: RE | Admit: 2011-12-06 | Discharge: 2011-12-06 | Disposition: A | Discharge status: Home / Self Care | Payer: BC Managed Care – PPO | Source: Ambulatory Visit | Attending: Family Medicine | Admitting: Family Medicine

## 2011-12-06 DIAGNOSIS — Z1231 Encounter for screening mammogram for malignant neoplasm of breast: Secondary | ICD-10-CM | POA: Insufficient documentation

## 2011-12-06 DIAGNOSIS — Z1239 Encounter for other screening for malignant neoplasm of breast: Secondary | ICD-10-CM

## 2012-07-09 ENCOUNTER — Encounter (HOSPITAL_COMMUNITY): Payer: Self-pay | Admitting: Pharmacy Technician

## 2012-07-10 ENCOUNTER — Encounter (HOSPITAL_COMMUNITY)
Admission: RE | Admit: 2012-07-10 | Discharge: 2012-07-10 | Disposition: A | Discharge status: Home / Self Care | Payer: BC Managed Care – PPO | Source: Ambulatory Visit | Attending: Podiatry | Admitting: Podiatry

## 2012-07-10 ENCOUNTER — Encounter (HOSPITAL_COMMUNITY): Payer: Self-pay

## 2012-07-10 LAB — CBC
Hemoglobin: 14 g/dL (ref 12.0–15.0)
MCH: 32.9 pg (ref 26.0–34.0)
MCHC: 35.1 g/dL (ref 30.0–36.0)
WBC: 4.9 10*3/uL (ref 4.0–10.5)

## 2012-07-10 LAB — BASIC METABOLIC PANEL
CO2: 32 mEq/L (ref 19–32)
Calcium: 10.3 mg/dL (ref 8.4–10.5)
GFR calc Af Amer: >90 mL/min (ref >90)
Glucose, Bld: 88 mg/dL (ref 70–99)
Potassium: 4.8 mEq/L (ref 3.5–5.1)

## 2012-07-10 LAB — SURGICAL PCR SCREEN: Staphylococcus aureus: POSITIVE — AB

## 2012-07-10 NOTE — Patient Instructions (Signed)
Ashley [name]  07/10/2012   Your procedure is scheduled on:  07/12/12  Report to [facility] at 06:15 AM.  Call this number if you have problems the morning of surgery: [phone]   Remember:   Do not eat food or drink liquids after midnight.   Take these medicines the morning of surgery with A SIP OF WATER: None   Do not wear jewelry, make-up or nail polish.  Do not wear lotions, powders, or perfumes.   Do not shave 48 hours prior to surgery. Men may shave face and neck.  Do not bring valuables to the hospital.  Contacts, dentures or bridgework may not be worn into surgery.  Leave suitcase in the car. After surgery it may be brought to your room.  For patients admitted to the hospital, checkout time is 11:00 AM the day of discharge.   Patients discharged the day of surgery will not be allowed to drive home.   Special Instructions: Shower using CHG 2 nights before surgery and the night before surgery.  If you shower the day of surgery use CHG.  Use special wash - you have one bottle of CHG for all showers.  You should use approximately 1/3 of the bottle for each shower.   Please read over the following fact sheets that you were given: Pain Booklet, MRSA Information, Surgical Site Infection Prevention, Anesthesia Post-op Instructions and Care and Recovery After Surgery    PATIENT INSTRUCTIONS POST-ANESTHESIA  IMMEDIATELY FOLLOWING SURGERY:  Do not drive or operate machinery for the first twenty four hours after surgery.  Do not make any important decisions for twenty four hours after surgery or while taking narcotic pain medications or sedatives.  If you develop intractable nausea and vomiting or a severe headache please notify your doctor immediately.  FOLLOW-UP:  Please make an appointment with your surgeon as instructed. You do not need to follow up with anesthesia unless specifically instructed to do so.  WOUND CARE INSTRUCTIONS (if applicable):  Keep a dry clean dressing on the  anesthesia/puncture wound site if there is drainage.  Once the wound has quit draining you may leave it open to air.  Generally you should leave the bandage intact for twenty four hours unless there is drainage.  If the epidural site drains for more than 36-48 hours please call the anesthesia department.  QUESTIONS?:  Please feel free to call your physician or the hospital operator if you have any questions, and they will be happy to assist you.

## 2012-07-11 ENCOUNTER — Ambulatory Visit (HOSPITAL_COMMUNITY)
Admission: RE | Admit: 2012-07-11 | Discharge: 2012-07-11 | Disposition: A | Discharge status: Home / Self Care | Payer: BC Managed Care – PPO | Source: Ambulatory Visit | Attending: Podiatry | Admitting: Podiatry

## 2012-07-11 MED ORDER — MUPIROCIN 2 % EX OINT
TOPICAL_OINTMENT | CUTANEOUS | Status: AC
  Filled 2012-07-11: qty 22

## 2012-07-12 ENCOUNTER — Ambulatory Visit (HOSPITAL_COMMUNITY): Payer: BC Managed Care – PPO | Admitting: Anesthesiology

## 2012-07-12 ENCOUNTER — Encounter (HOSPITAL_COMMUNITY)
Admission: RE | Disposition: A | Discharge status: Home / Self Care | Payer: Self-pay | Source: Ambulatory Visit | Attending: Podiatry

## 2012-07-12 ENCOUNTER — Encounter (HOSPITAL_COMMUNITY): Payer: Self-pay | Admitting: *Deleted

## 2012-07-12 ENCOUNTER — Ambulatory Visit (HOSPITAL_COMMUNITY)
Admission: RE | Admit: 2012-07-12 | Discharge: 2012-07-12 | Disposition: A | Discharge status: Home / Self Care | Payer: BC Managed Care – PPO | Source: Ambulatory Visit | Attending: Podiatry | Admitting: Podiatry

## 2012-07-12 ENCOUNTER — Encounter (HOSPITAL_COMMUNITY): Payer: Self-pay | Admitting: Anesthesiology

## 2012-07-12 ENCOUNTER — Ambulatory Visit (HOSPITAL_COMMUNITY): Payer: BC Managed Care – PPO

## 2012-07-12 DIAGNOSIS — M205X9 Other deformities of toe(s) (acquired), unspecified foot: Secondary | ICD-10-CM | POA: Insufficient documentation

## 2012-07-12 DIAGNOSIS — M205X1 Other deformities of toe(s) (acquired), right foot: Secondary | ICD-10-CM

## 2012-07-12 HISTORY — PX: FOOT ARTHRODESIS: SHX1655

## 2012-07-12 SURGERY — FUSION, JOINT, FOOT
Anesthesia: Monitor Anesthesia Care | Site: Foot | Laterality: Right | Wound class: Clean

## 2012-07-12 MED ORDER — CEFAZOLIN SODIUM-DEXTROSE 2-3 GM-% IV SOLR
2.0000 g | Freq: Once | INTRAVENOUS | Status: AC
  Administered 2012-07-12: 2 g via INTRAVENOUS

## 2012-07-12 MED ORDER — ONDANSETRON HCL 4 MG/2ML IJ SOLN: 4.0000 mg | Freq: Once | INTRAMUSCULAR | Status: DC | PRN

## 2012-07-12 MED ORDER — MIDAZOLAM HCL 2 MG/2ML IJ SOLN
1.0000 mg | INTRAMUSCULAR | Status: DC | PRN
  Administered 2012-07-12: 2 mg via INTRAVENOUS

## 2012-07-12 MED ORDER — CEFAZOLIN SODIUM-DEXTROSE 2-3 GM-% IV SOLR
INTRAVENOUS | Status: AC
  Filled 2012-07-12: qty 50

## 2012-07-12 MED ORDER — MIDAZOLAM HCL 5 MG/5ML IJ SOLN
INTRAMUSCULAR | Status: DC | PRN
  Administered 2012-07-12 (×3): 2 mg via INTRAVENOUS

## 2012-07-12 MED ORDER — BUPIVACAINE HCL (PF) 0.5 % IJ SOLN
INTRAMUSCULAR | Status: AC
  Filled 2012-07-12: qty 30

## 2012-07-12 MED ORDER — FENTANYL CITRATE 0.05 MG/ML IJ SOLN
25.0000 ug | INTRAMUSCULAR | Status: DC | PRN
  Administered 2012-07-12: 25 ug via INTRAVENOUS

## 2012-07-12 MED ORDER — FENTANYL CITRATE 0.05 MG/ML IJ SOLN
INTRAMUSCULAR | Status: AC
  Filled 2012-07-12: qty 2

## 2012-07-12 MED ORDER — FENTANYL CITRATE 0.05 MG/ML IJ SOLN
INTRAMUSCULAR | Status: DC | PRN
  Administered 2012-07-12: 75 ug via INTRAVENOUS
  Administered 2012-07-12 (×4): 25 ug via INTRAVENOUS

## 2012-07-12 MED ORDER — PROPOFOL 10 MG/ML IV EMUL
INTRAVENOUS | Status: AC
  Filled 2012-07-12: qty 20

## 2012-07-12 MED ORDER — MIDAZOLAM HCL 2 MG/2ML IJ SOLN
INTRAMUSCULAR | Status: AC
  Filled 2012-07-12: qty 2

## 2012-07-12 MED ORDER — LIDOCAINE HCL (PF) 1 % IJ SOLN
INTRAMUSCULAR | Status: AC
  Filled 2012-07-12: qty 5

## 2012-07-12 MED ORDER — LACTATED RINGERS IV SOLN
INTRAVENOUS | Status: DC
  Administered 2012-07-12: 07:00:00 via INTRAVENOUS

## 2012-07-12 MED ORDER — FENTANYL CITRATE 0.05 MG/ML IJ SOLN: 25.0000 ug | INTRAMUSCULAR | Status: DC | PRN

## 2012-07-12 MED ORDER — SODIUM CHLORIDE 0.9 % IR SOLN
Status: DC | PRN
  Administered 2012-07-12: 1000 mL

## 2012-07-12 MED ORDER — BUPIVACAINE HCL (PF) 0.5 % IJ SOLN
INTRAMUSCULAR | Status: DC | PRN
  Administered 2012-07-12: 20 mL

## 2012-07-12 MED ORDER — PROPOFOL INFUSION 10 MG/ML OPTIME
INTRAVENOUS | Status: DC | PRN
  Administered 2012-07-12: 55 ug/kg/min via INTRAVENOUS

## 2012-07-12 SURGICAL SUPPLY — 59 items
APL SKNCLS STERI-STRIP NONHPOA (GAUZE/BANDAGES/DRESSINGS) ×1
BAG HAMPER (MISCELLANEOUS) ×2 IMPLANT
BANDAGE CONFORM 2  STR LF (GAUZE/BANDAGES/DRESSINGS) ×2 IMPLANT
BANDAGE ELASTIC 4 VELCRO NS (GAUZE/BANDAGES/DRESSINGS) ×2 IMPLANT
BANDAGE ESMARK 4X12 BL STRL LF (DISPOSABLE) ×1 IMPLANT
BANDAGE GAUZE ELAST BULKY 4 IN (GAUZE/BANDAGES/DRESSINGS) ×2 IMPLANT
BENZOIN TINCTURE PRP APPL 2/3 (GAUZE/BANDAGES/DRESSINGS) ×2 IMPLANT
BLADE AVERAGE 25X9 (BLADE) ×2 IMPLANT
BLADE SURG 15 STRL LF DISP TIS (BLADE) ×2 IMPLANT
BLADE SURG 15 STRL SS (BLADE) ×4
BNDG CMPR 12X4 ELC STRL LF (DISPOSABLE) ×1
BNDG ESMARK 4X12 BLUE STRL LF (DISPOSABLE) ×2
CHLORAPREP W/TINT 26ML (MISCELLANEOUS) ×2 IMPLANT
CLOTH BEACON ORANGE TIMEOUT ST (SAFETY) ×2 IMPLANT
COVER LIGHT HANDLE STERIS (MISCELLANEOUS) ×4 IMPLANT
CUFF TOURNIQUET SINGLE 18IN (TOURNIQUET CUFF) ×1 IMPLANT
DECANTER SPIKE VIAL GLASS SM (MISCELLANEOUS) ×2 IMPLANT
DRAPE OEC MINIVIEW 54X84 (DRAPES) ×2 IMPLANT
DRILL  BIT 2.0MM/L 110MMAO ×1 IMPLANT
DRILL BIT 2.5MM/L 110MMAO ×1 IMPLANT
DRILL BIT 3.5MM (BIT) ×1 IMPLANT
DRSG ADAPTIC 3X8 NADH LF (GAUZE/BANDAGES/DRESSINGS) ×2 IMPLANT
DURA STEPPER LG (CAST SUPPLIES) IMPLANT
DURA STEPPER MED (CAST SUPPLIES) IMPLANT
DURA STEPPER SML (CAST SUPPLIES) ×1 IMPLANT
DURA STEPPER XL (SOFTGOODS) IMPLANT
ELECT REM PT RETURN 9FT ADLT (ELECTROSURGICAL) ×2
ELECTRODE REM PT RTRN 9FT ADLT (ELECTROSURGICAL) ×1 IMPLANT
GLOVE BIO SURGEON STRL SZ7.5 (GLOVE) ×2 IMPLANT
GLOVE BIOGEL PI IND STRL 7.0 (GLOVE) IMPLANT
GLOVE BIOGEL PI INDICATOR 7.0 (GLOVE) ×2
GLOVE ECLIPSE 6.5 STRL STRAW (GLOVE) ×3 IMPLANT
GOWN STRL REIN XL XLG (GOWN DISPOSABLE) ×6 IMPLANT
K-WIRE 1.2X70 (WIRE) ×1 IMPLANT
K-WIRE 1.6X100 (WIRE) ×4
K-WIRE FX100X1.6XSMTH TROC (WIRE) ×2
K-WIRE OLIVE 1.2X65 (WIRE) ×2
KIT ROOM TURNOVER AP CYSTO (KITS) ×2 IMPLANT
KWIRE FX100X1.6XSMTH TROC (WIRE) IMPLANT
KWIRE OLIVE 1.2X65 (WIRE) IMPLANT
MANIFOLD NEPTUNE II (INSTRUMENTS) ×2 IMPLANT
NDL HYPO 27GX1-1/4 (NEEDLE) ×3 IMPLANT
NEEDLE HYPO 27GX1-1/4 (NEEDLE) ×4 IMPLANT
NS IRRIG 1000ML POUR BTL (IV SOLUTION) ×2 IMPLANT
PACK BASIC LIMB (CUSTOM PROCEDURE TRAY) ×2 IMPLANT
PAD ARMBOARD 7.5X6 YLW CONV (MISCELLANEOUS) ×2 IMPLANT
PLATE MTP RT (Plate) ×1 IMPLANT
RASP SM TEAR CROSS CUT (RASP) ×2 IMPLANT
SCREW LOCK 3.0X12MM (Screw) ×3 IMPLANT
SCREW LOCK 3.0X14MM (Screw) ×1 IMPLANT
SCREW LOCK 3.5X20MM (Screw) ×1 IMPLANT
SET BASIN LINEN APH (SET/KITS/TRAYS/PACK) ×2 IMPLANT
SPONGE GAUZE 4X4 12PLY (GAUZE/BANDAGES/DRESSINGS) ×2 IMPLANT
STRIP CLOSURE SKIN 1/2X4 (GAUZE/BANDAGES/DRESSINGS) ×3 IMPLANT
SUT PROLENE 4 0 PS 2 18 (SUTURE) IMPLANT
SUT VIC AB 2-0 CT2 27 (SUTURE) ×1 IMPLANT
SUT VIC AB 4-0 PS2 27 (SUTURE) ×2 IMPLANT
SUT VICRYL AB 3-0 FS1 BRD 27IN (SUTURE) ×2 IMPLANT
SYR CONTROL 10ML LL (SYRINGE) ×4 IMPLANT

## 2012-07-12 NOTE — Op Note (Signed)
OPERATIVE NOTE  DATE OF PROCEDURE:  07/12/2012  SURGEON:   Dallas Schimke, DPM  OR STAFF:   Circulator: Lizabeth Leyden, RN Scrub Person: Diana Eves, CST RN First Assistant: Eliane Decree Page, RN   PREOPERATIVE DIAGNOSIS:   Hallux limitus, right foot  POSTOPERATIVE DIAGNOSIS: Same  PROCEDURE: Arthrodesis of the 1st metatarsophalangeal joint, right foot  ANESTHESIA:  Monitor Anesthesia Care   HEMOSTASIS:   Pneumatic ankle tourniquet set at 250 mmHg  ESTIMATED BLOOD LOSS:   Minimal (<5 cc)  MATERIALS USED:  1st MPT plate and screws from [employer] [place] Cross Plate System  INJECTABLES: Marcaine 0.5% plain; 20mL  PATHOLOGY:   None  COMPLICATIONS:   None  INDICATIONS:  Painful right great toe joint that has been nonresponsive to alterations in shoe gear and over-the-counter pain medication.  DESCRIPTION OF THE PROCEDURE:   The patient was brought to the operating room and placed on the operative table in the supine position.  A pneumatic ankle tourniquet was applied to the patient's ankle.  Following sedation, the surgical site was anesthetized with 0.5% Marcaine plain.  The foot was then prepped, scrubbed, and draped in the usual sterile technique.  The foot was elevated, exsanguinated and the pneumatic ankle tourniquet inflated to 250 mmHg.    Attention was then directed to the dorsomedial aspect of the first metatarsal phalangeal joint where a 6-cm linear incision was made medial and parallel to the course of the extensor hallucis longus tendon.  The incision was deepened through subcutaneous tissues.  Vital neurovascular structures were identified and retracted.  All bleeders were identified and cauterized.  The incision was deepened to the level of the first metatarsal phalangeal joint capsule.  A linear longitudinal capsulotomy was performed over the dorsal aspect of the first metatarsal phalangeal joint.  The capsular and periosteal structures were  dissected free of their osseous attachments and reflected medially and laterally thus exposing the head of the first metatarsal and proximal phalanx at the operative site.  Dorsal exostosis of the first metatarsal and proximal phalanx was noted.  Adaptic changes were also noted along the lateral aspect of the joint.  Approximately 90% of the articular surface had eroded from the first metatarsal head.  The spurs were resected using a prior bone saw and rongeur.  Bone edges were smoothed with a power rasp.  A K wire was placed through the center of the metatarsal head and into the diaphysis.  Appropriate concave reamer was used to resect remaining articular surface down to bleeding subchondral bone.  A K wire was then placed through the center of the base of the proximal phalanx.  Convex reamer was then used to resect the residual articular surface down to bleeding subchondral bone.  The hallux was then placed in 5 of dorsiflexion.  A K wire was then insert across the first metatarsal phalangeal joint to provide to temporary fixation.  The cross plate template was applied a K wire was placed in the first metatarsal according to the guide.  The guide was removed and the CP reamer was advanced over the wire until appropriate depth was achieved.  The MTP plate was then applied over the joint.  It was secured using all of the wires.  2 locking screws were then placed through the proximal aspect of the plate into the first metatarsal bone.  A nonlocking screw was inserted through the CP hole in a lag fashion.  Adequate compression was noted.  2 locking screws were  then inserted through the distal aspect of the plate into the proximal phalanx.  Position of the plate and screws were confirmed with C-arm fluoroscopy.  The wound was irrigated with copious amounts of sterile irrigant.  The periosteal and capsular tissues were approximated with 3-0 Vicryl suture.  4-0 Vicryl was used to approximate the subcutaneous  tissues. 4-0 Vicryl was used to approximate the skin in a subcuticular manner.    The patient tolerated the procedure well.  The patient was then transferred to PACU with vital signs stable and vascular status intact to all toes of the operative foot.  Following a period of postoperative monitoring, the patient will be discharged home.

## 2012-07-12 NOTE — Anesthesia Postprocedure Evaluation (Signed)
  Anesthesia Post-op Note  Patient: Ashley [name]  Procedure(s) Performed: Procedure(s): ARTHRODESIS FOOT FIRST  METATARSOPHALANGEAL JOINT (Right)  Patient Location: PACU  Anesthesia Type:MAC  Level of Consciousness: awake, alert , oriented and patient cooperative  Airway and Oxygen Therapy: Patient Spontanous Breathing  Post-op Pain: none  Post-op Assessment: Post-op Vital signs reviewed, Patient's Cardiovascular Status Stable, Respiratory Function Stable, Patent Airway, No signs of Nausea or vomiting, Adequate PO intake and Pain level controlled  Post-op Vital Signs: Reviewed and stable  Complications: No apparent anesthesia complications

## 2012-07-12 NOTE — Anesthesia Preprocedure Evaluation (Signed)
Anesthesia Evaluation  Patient identified by MRN, date of birth, ID band Patient awake    Reviewed: Allergy & Precautions, H&P , NPO status , Patient's Chart, lab work & pertinent test results  Airway Mallampati: II TM Distance: >3 FB Neck ROM: Full    Dental  (+) Teeth Intact and Partial Upper   Pulmonary neg pulmonary ROS,  breath sounds clear to auscultation        Cardiovascular negative cardio ROS  Rhythm:Regular Rate:Normal     Neuro/Psych    GI/Hepatic negative GI ROS,   Endo/Other    Renal/GU      Musculoskeletal   Abdominal   Peds  Hematology   Anesthesia Other Findings   Reproductive/Obstetrics                           Anesthesia Physical Anesthesia Plan  ASA: I  Anesthesia Plan: MAC   Post-op Pain Management:    Induction: Intravenous  Airway Management Planned: Nasal Cannula  Additional Equipment:   Intra-op Plan:   Post-operative Plan:   Informed Consent: I have reviewed the patients History and Physical, chart, labs and discussed the procedure including the risks, benefits and alternatives for the proposed anesthesia with the patient or authorized representative who has indicated his/her understanding and acceptance.     Plan Discussed with:   Anesthesia Plan Comments:         Anesthesia Quick Evaluation

## 2012-07-12 NOTE — H&P (Signed)
HISTORY AND PHYSICAL INTERVAL NOTE:  07/12/2012  7:18 AM  Ashley [name]  has presented today for surgery, with the diagnosis of hallux limitus right foot.  The various methods of treatment have been discussed with the patient.  No guarantees were given.  After consideration of risks, benefits and other options for treatment, the patient has consented to surgery.  I have reviewed the patients' chart and labs.    Patient Vitals for the past 24 hrs:  BP Temp Temp src Pulse Resp SpO2 Height Weight  07/12/12 0640 119/69 mmHg 97.9 F (36.6 C) Oral 71 18 98 % 5\' 2"  (1.575 m) 68.947 kg (152 lb)    A history and physical examination was performed in my office.  The patient was reexamined.  There have been no changes to this history and physical examination.  Dallas Schimke, DPM

## 2012-07-12 NOTE — Transfer of Care (Signed)
Immediate Anesthesia Transfer of Care Note  Patient: Ashley [name]  Procedure(s) Performed: Procedure(s): ARTHRODESIS FOOT FIRST  METATARSOPHALANGEAL JOINT (Right)  Patient Location: PACU  Anesthesia Type:MAC  Level of Consciousness: awake, alert , oriented and patient cooperative  Airway & Oxygen Therapy: Patient Spontanous Breathing and Patient connected to nasal cannula oxygen  Post-op Assessment: Report given to PACU RN, Post -op Vital signs reviewed and stable and Patient moving all extremities  Post vital signs: Reviewed and stable  Complications: No apparent anesthesia complications

## 2012-07-23 ENCOUNTER — Encounter (HOSPITAL_COMMUNITY): Payer: Self-pay | Admitting: Podiatry

## 2013-01-10 ENCOUNTER — Other Ambulatory Visit: Payer: Self-pay

## 2013-01-14 ENCOUNTER — Other Ambulatory Visit: Payer: Self-pay | Admitting: Family Medicine

## 2013-01-14 DIAGNOSIS — Z139 Encounter for screening, unspecified: Secondary | ICD-10-CM

## 2013-01-21 ENCOUNTER — Ambulatory Visit (INDEPENDENT_AMBULATORY_CARE_PROVIDER_SITE_OTHER): Payer: BC Managed Care – PPO | Admitting: Family Medicine

## 2013-01-21 ENCOUNTER — Encounter: Payer: Self-pay | Admitting: Family Medicine

## 2013-01-21 ENCOUNTER — Ambulatory Visit (HOSPITAL_COMMUNITY)
Admission: RE | Admit: 2013-01-21 | Discharge: 2013-01-21 | Disposition: A | Discharge status: Home / Self Care | Payer: BC Managed Care – PPO | Source: Ambulatory Visit | Attending: Family Medicine | Admitting: Family Medicine

## 2013-01-21 ENCOUNTER — Other Ambulatory Visit: Payer: Self-pay | Admitting: Family Medicine

## 2013-01-21 VITALS — BP 110/80 | HR 62 | Temp 97.7°F | Resp 18 | Ht 60.0 in | Wt 158.0 lb

## 2013-01-21 DIAGNOSIS — N390 Urinary tract infection, site not specified: Secondary | ICD-10-CM

## 2013-01-21 DIAGNOSIS — Z1321 Encounter for screening for nutritional disorder: Secondary | ICD-10-CM

## 2013-01-21 DIAGNOSIS — Z139 Encounter for screening, unspecified: Secondary | ICD-10-CM

## 2013-01-21 DIAGNOSIS — E785 Hyperlipidemia, unspecified: Secondary | ICD-10-CM

## 2013-01-21 DIAGNOSIS — Z0000 Encounter for general adult medical examination without abnormal findings: Secondary | ICD-10-CM

## 2013-01-21 DIAGNOSIS — Z23 Encounter for immunization: Secondary | ICD-10-CM

## 2013-01-21 DIAGNOSIS — Z130 Encounter for screening for diseases of the blood and blood-forming organs and certain disorders involving the immune mechanism: Secondary | ICD-10-CM

## 2013-01-21 DIAGNOSIS — E669 Obesity, unspecified: Secondary | ICD-10-CM

## 2013-01-21 DIAGNOSIS — Z1231 Encounter for screening mammogram for malignant neoplasm of breast: Secondary | ICD-10-CM | POA: Insufficient documentation

## 2013-01-21 LAB — COMPREHENSIVE METABOLIC PANEL
AST: 30 U/L (ref 0–37)
BUN: 14 mg/dL (ref 6–23)
CO2: 24 mEq/L (ref 19–32)
Calcium: 9.9 mg/dL (ref 8.4–10.5)
Creat: 0.82 mg/dL (ref 0.50–1.10)
Potassium: 5.2 mEq/L (ref 3.5–5.3)
Sodium: 140 mEq/L (ref 135–145)

## 2013-01-21 LAB — URINALYSIS, ROUTINE W REFLEX MICROSCOPIC
Ketones, ur: NEGATIVE mg/dL
Nitrite: NEGATIVE
Protein, ur: NEGATIVE mg/dL
pH: 5.5 (ref 5.0–8.0)

## 2013-01-21 LAB — CBC WITH DIFFERENTIAL/PLATELET
Basophils Relative: 1 % (ref 0–1)
HCT: 40.7 % (ref 36.0–46.0)
Hemoglobin: 14 g/dL (ref 12.0–15.0)
Lymphocytes Relative: 40 % (ref 12–46)
Lymphs Abs: 2.1 10*3/uL (ref 0.7–4.0)
MCHC: 34.4 g/dL (ref 30.0–36.0)
Monocytes Absolute: 0.4 10*3/uL (ref 0.1–1.0)
Monocytes Relative: 7 % (ref 3–12)
Neutro Abs: 2.7 10*3/uL (ref 1.7–7.7)
RBC: 4.28 MIL/uL (ref 3.87–5.11)

## 2013-01-21 LAB — LIPID PANEL
Cholesterol: 250 mg/dL — ABNORMAL HIGH (ref 0–200)
HDL: 67 mg/dL (ref >39)
LDL Cholesterol: 156 mg/dL — ABNORMAL HIGH (ref 0–99)
Total CHOL/HDL Ratio: 3.7 Ratio
Triglycerides: 134 mg/dL (ref <150)
VLDL: 27 mg/dL (ref 0–40)

## 2013-01-21 LAB — URINALYSIS, MICROSCOPIC ONLY: Casts: NONE SEEN

## 2013-01-21 MED ORDER — CIPROFLOXACIN HCL 500 MG PO TABS: 500.0000 mg | ORAL_TABLET | Freq: Two times a day (BID) | ORAL | Status: DC

## 2013-01-21 NOTE — Patient Instructions (Signed)
I recommend eye visit once a year I recommend dental visit every 6 months Goal is to  Exercise 30 minutes 5 days a week We will send a letter with lab results  Start cipro for the bladder infection Mammogram today F/U 1 year or as needed

## 2013-01-21 NOTE — Assessment & Plan Note (Signed)
No PAP needed MAMMO TODAY FLU

## 2013-01-21 NOTE — Assessment & Plan Note (Signed)
Cipro given

## 2013-01-21 NOTE — Progress Notes (Signed)
  Subjective:    Patient ID: Ashley [name], female    DOB: Feb 02, 1966, 47 y.o.   MRN: [id number]  HPI  Patient here for complete physical exam. She does complain of dysuria for the past week. She has a lot of pressure and burning with urination. She denies any abdominal pain. She has been taking AZO over-the-counter and using cranberry juice with minimal improvement. She denies any vaginal discharge  She's status post hysterectomy she had a normal Pap smear off her vaginal cuff last year she no longer requires pap  Mammogram is scheduled for today Fasting labs Flu shot  She is exercising a couple days a week Her stress levels have been well-controlled  Review of Systems  GEN- denies fatigue, fever, weight loss,weakness, recent illness HEENT- denies eye drainage, change in vision, nasal discharge, CVS- denies chest pain, palpitations RESP- denies SOB, cough, wheeze ABD- denies N/V, change in stools, abd pain GU- denies dysuria, hematuria, dribbling, incontinence MSK- denies joint pain, muscle aches, injury Neuro- denies headache, dizziness, syncope, seizure activity      Objective:   Physical Exam GEN- NAD, alert and oriented x3 HEENT- PERRL, EOMI, non injected sclera, pink conjunctiva, MMM, oropharynx clear, TM clear bilat Neck- Supple, no thryomegaly CVS- RRR, no murmur RESP-CTAB ABD-NABS,soft,NT,ND EXT- No edema Pulses- Radial, DP- 2+ GU- deferred Psych- normal affect and mood       Assessment & Plan:

## 2013-01-21 NOTE — Assessment & Plan Note (Signed)
Fasting labs today

## 2013-01-21 NOTE — Assessment & Plan Note (Signed)
Discussed diet and exercise 

## 2013-02-04 ENCOUNTER — Other Ambulatory Visit: Payer: Self-pay | Admitting: *Deleted

## 2013-02-04 MED ORDER — SIMVASTATIN 10 MG PO TABS: 10.0000 mg | ORAL_TABLET | Freq: Every day | ORAL | Status: DC

## 2013-02-04 NOTE — Telephone Encounter (Signed)
Meds refilled.

## 2013-04-29 ENCOUNTER — Ambulatory Visit (INDEPENDENT_AMBULATORY_CARE_PROVIDER_SITE_OTHER): Payer: BC Managed Care – PPO | Admitting: Family Medicine

## 2013-04-29 ENCOUNTER — Encounter: Payer: Self-pay | Admitting: Family Medicine

## 2013-04-29 VITALS — BP 122/62 | HR 60 | Temp 98.0°F | Resp 16 | Ht 60.5 in | Wt 154.0 lb

## 2013-04-29 DIAGNOSIS — E785 Hyperlipidemia, unspecified: Secondary | ICD-10-CM

## 2013-04-29 LAB — COMPREHENSIVE METABOLIC PANEL
ALBUMIN: 4.4 g/dL (ref 3.5–5.2)
ALK PHOS: 64 U/L (ref 39–117)
ALT: 20 U/L (ref 0–35)
AST: 20 U/L (ref 0–37)
BUN: 12 mg/dL (ref 6–23)
CO2: 27 mEq/L (ref 19–32)
Calcium: 9.5 mg/dL (ref 8.4–10.5)
Chloride: 107 mEq/L (ref 96–112)
Creat: 0.76 mg/dL (ref 0.50–1.10)
Glucose, Bld: 102 mg/dL — ABNORMAL HIGH (ref 70–99)
POTASSIUM: 5 meq/L (ref 3.5–5.3)
SODIUM: 139 meq/L (ref 135–145)
TOTAL PROTEIN: 7.3 g/dL (ref 6.0–8.3)
Total Bilirubin: 0.3 mg/dL (ref 0.2–1.2)

## 2013-04-29 LAB — LIPID PANEL
Cholesterol: 177 mg/dL (ref 0–200)
HDL: 63 mg/dL (ref >39)
LDL CALC: 96 mg/dL (ref 0–99)
Total CHOL/HDL Ratio: 2.8 Ratio
Triglycerides: 89 mg/dL (ref <150)
VLDL: 18 mg/dL (ref 0–40)

## 2013-04-29 NOTE — Patient Instructions (Signed)
Stop the zocor  We will call with lab results F/u November for physical

## 2013-04-29 NOTE — Progress Notes (Signed)
Patient ID: Ashley FeltLori N Talarico, female   DOB: 09/21/1965, 48 y.o.   MRN: [id number]   Subjective:    Patient ID: Ashley [name], female    DOB: 11/05/1965, 48 y.o.   MRN: [id number]  Patient presents for 3 month f/u  patient here to followup hyperlipidemia. At her physical exam in November she was noted to have hyperlipidemia which is been persistent from the years before. She was started on simvastatin 10 mg at bedtime. She does have family history of hyperlipidemia. She does note the past couple months and she's had increased joint pains and muscle aches since being on the medicine but continued to take it. It also causes some fatigue. She continues to watch her diet and has lost another 4 pounds since her last visit    Review Of Systems:  GEN- denies fatigue, fever, weight loss,weakness, recent illness CVS- denies chest pain, palpitations RESP- denies SOB, cough, wheeze MSK- + joint pain, +muscle aches, injury Neuro- denies headache, dizziness, syncope, seizure activity       Objective:    BP 122/62  Pulse 60  Temp(Src) 98 F (36.7 C)  Resp 16  Ht 5' 0.5" (1.537 m)  Wt 154 lb (69.854 kg)  BMI 29.57 kg/m2 GEN- NAD, alert and oriented x3 EXT- No edema         Assessment & Plan:      Problem List Items Addressed This Visit   Hyperlipidemia - Primary     Recheck FLP, CMET D/c zocor due to side effects. We may try her on pravastatin or zetia    Relevant Orders      Comprehensive metabolic panel      Lipid panel      Note: This dictation was prepared with Dragon dictation along with smaller phrase technology. Any transcriptional errors that result from this process are unintentional.

## 2013-04-29 NOTE — Assessment & Plan Note (Addendum)
Recheck FLP, CMET D/c zocor due to side effects. We may try her on pravastatin or zetia

## 2013-04-30 MED ORDER — PRAVASTATIN SODIUM 20 MG PO TABS: 20.0000 mg | ORAL_TABLET | Freq: Every day | ORAL | Status: DC

## 2013-04-30 NOTE — Addendum Note (Signed)
Addended by: Milinda AntisURHAM, KAWANTA F on: 04/30/2013 12:58 PM   Modules accepted: Orders, Medications

## 2013-12-13 ENCOUNTER — Other Ambulatory Visit: Payer: BC Managed Care – PPO

## 2013-12-13 DIAGNOSIS — Z0000 Encounter for general adult medical examination without abnormal findings: Secondary | ICD-10-CM

## 2013-12-13 DIAGNOSIS — Z79899 Other long term (current) drug therapy: Secondary | ICD-10-CM

## 2013-12-13 DIAGNOSIS — E785 Hyperlipidemia, unspecified: Secondary | ICD-10-CM

## 2013-12-13 LAB — COMPLETE METABOLIC PANEL WITH GFR
ALT: 16 U/L (ref 0–35)
AST: 19 U/L (ref 0–37)
Albumin: 4.1 g/dL (ref 3.5–5.2)
Alkaline Phosphatase: 67 U/L (ref 39–117)
BILIRUBIN TOTAL: 0.3 mg/dL (ref 0.2–1.2)
BUN: 14 mg/dL (ref 6–23)
CO2: 28 meq/L (ref 19–32)
CREATININE: 0.74 mg/dL (ref 0.50–1.10)
Calcium: 9.2 mg/dL (ref 8.4–10.5)
Chloride: 104 mEq/L (ref 96–112)
GLUCOSE: 96 mg/dL (ref 70–99)
Potassium: 4.4 mEq/L (ref 3.5–5.3)
Sodium: 139 mEq/L (ref 135–145)
Total Protein: 6.8 g/dL (ref 6.0–8.3)

## 2013-12-13 LAB — CBC WITH DIFFERENTIAL/PLATELET
Basophils Absolute: 0 10*3/uL (ref 0.0–0.1)
Basophils Relative: 1 % (ref 0–1)
EOS ABS: 0.2 10*3/uL (ref 0.0–0.7)
EOS PCT: 4 % (ref 0–5)
HCT: 38.1 % (ref 36.0–46.0)
Hemoglobin: 13.5 g/dL (ref 12.0–15.0)
LYMPHS ABS: 2.5 10*3/uL (ref 0.7–4.0)
Lymphocytes Relative: 52 % — ABNORMAL HIGH (ref 12–46)
MCH: 32.8 pg (ref 26.0–34.0)
MCHC: 35.4 g/dL (ref 30.0–36.0)
MCV: 92.7 fL (ref 78.0–100.0)
MONOS PCT: 9 % (ref 3–12)
Monocytes Absolute: 0.4 10*3/uL (ref 0.1–1.0)
Neutro Abs: 1.6 10*3/uL — ABNORMAL LOW (ref 1.7–7.7)
Neutrophils Relative %: 34 % — ABNORMAL LOW (ref 43–77)
PLATELETS: 262 10*3/uL (ref 150–400)
RBC: 4.11 MIL/uL (ref 3.87–5.11)
RDW: 13.2 % (ref 11.5–15.5)
WBC: 4.8 10*3/uL (ref 4.0–10.5)

## 2013-12-13 LAB — LIPID PANEL
CHOL/HDL RATIO: 4 ratio
CHOLESTEROL: 217 mg/dL — AB (ref 0–200)
HDL: 54 mg/dL (ref >39)
LDL Cholesterol: 136 mg/dL — ABNORMAL HIGH (ref 0–99)
Triglycerides: 137 mg/dL (ref <150)
VLDL: 27 mg/dL (ref 0–40)

## 2013-12-17 ENCOUNTER — Ambulatory Visit (INDEPENDENT_AMBULATORY_CARE_PROVIDER_SITE_OTHER): Payer: BC Managed Care – PPO | Admitting: Family Medicine

## 2013-12-17 ENCOUNTER — Encounter: Payer: Self-pay | Admitting: Family Medicine

## 2013-12-17 VITALS — BP 128/70 | HR 64 | Temp 97.8°F | Resp 12 | Ht 63.0 in | Wt 150.0 lb

## 2013-12-17 DIAGNOSIS — G4700 Insomnia, unspecified: Secondary | ICD-10-CM

## 2013-12-17 DIAGNOSIS — Z23 Encounter for immunization: Secondary | ICD-10-CM

## 2013-12-17 DIAGNOSIS — Z0000 Encounter for general adult medical examination without abnormal findings: Secondary | ICD-10-CM

## 2013-12-17 DIAGNOSIS — F411 Generalized anxiety disorder: Secondary | ICD-10-CM

## 2013-12-17 DIAGNOSIS — E785 Hyperlipidemia, unspecified: Secondary | ICD-10-CM

## 2013-12-17 MED ORDER — LORAZEPAM 0.5 MG PO TABS: 0.5000 mg | ORAL_TABLET | Freq: Two times a day (BID) | ORAL | Status: DC | PRN

## 2013-12-17 NOTE — Assessment & Plan Note (Signed)
CPE done, Mammogram scheduled for Nov Immunizations UTD

## 2013-12-17 NOTE — Patient Instructions (Signed)
I recommend eye visit once a year I recommend dental visit every 6 months Goal is to  Exercise 30 minutes 5 days a week We will send a letter with lab results  Flu shot  F/U as needed or in 1 year

## 2013-12-17 NOTE — Progress Notes (Signed)
Patient ID: Ashley [name], female   DOB: 09/01/1965, 48 y.o.   MRN: [id number]   Subjective:    Patient ID: Ashley [name], female    DOB: 09/15/1965, 48 y.o.   MRN: [id number]  Patient presents for CPE and Medication Review  She's status post hysterectomy she had negative vaginal cuff Pap smear 2 years ago therefore does not require any further Pap smears. She's due for mammogram in November. Colonoscopy age 48. Fasting labs to review with her. She was unable to tolerate her statin drug but has changed her diet significantly and has lost 5 pounds.  She was laid off and now back to her old job at [employer]- she has had some difficulty with the shift changes regarding her sleep and stress level. She was given lorazepam a couple years ago she still has the bottle from 2012 and try to use this when she felt her anxiety increasing. Her mother was also recently sick contributing to some anxiety and stress. She does not feel depressed. Denies suicidal ideation, hallucinations  Review Of Systems:  GEN- denies fatigue, fever, weight loss,weakness, recent illness HEENT- denies eye drainage, change in vision, nasal discharge, CVS- denies chest pain, palpitations RESP- denies SOB, cough, wheeze ABD- denies N/V, change in stools, abd pain GU- denies dysuria, hematuria, dribbling, incontinence MSK- denies joint pain, muscle aches, injury Neuro- denies headache, dizziness, syncope, seizure activity       Objective:    BP 128/70  Pulse 64  Temp(Src) 97.8 F (36.6 C) (Oral)  Resp 12  Ht 5\' 3"  (1.6 m)  Wt 150 lb (68.04 kg)  BMI 26.58 kg/m2 GEN- NAD, alert and oriented x3 HEENT- PERRL, EOMI, non injected sclera, pink conjunctiva, MMM, oropharynx clear Neck- Supple, no thyromegaly CVS- RRR, no murmur RESP-CTAB ABD-NABS,soft,NT,ND Psych- normal affect and mood EXT- No edema Pulses- Radial, DP- 2+        Assessment & Plan:      Problem List Items Addressed This Visit   Insomnia   Anxiety  state - Primary   Relevant Medications      LORazepam (ATIVAN) tablet    Other Visit Diagnoses   Need for prophylactic vaccination and inoculation against influenza        Relevant Orders       Flu Vaccine QUAD 36+ mos PF IM (Fluarix Quad PF) (Completed)       Note: This dictation was prepared with Dragon dictation along with smaller phrase technology. Any transcriptional errors that result from this process are unintentional.

## 2013-12-17 NOTE — Assessment & Plan Note (Signed)
Much improved with dietary changes, intolerant of statin drugs

## 2013-12-17 NOTE — Assessment & Plan Note (Signed)
Due to change in shifts, given ativan 0.5mg  as needed will also help with anxiety symptoms, she uses very sparingly

## 2013-12-25 ENCOUNTER — Other Ambulatory Visit: Payer: Self-pay | Admitting: Family Medicine

## 2013-12-25 DIAGNOSIS — Z1231 Encounter for screening mammogram for malignant neoplasm of breast: Secondary | ICD-10-CM

## 2014-01-01 ENCOUNTER — Ambulatory Visit (HOSPITAL_COMMUNITY): Payer: Self-pay

## 2014-01-22 ENCOUNTER — Ambulatory Visit (HOSPITAL_COMMUNITY)
Admission: RE | Admit: 2014-01-22 | Discharge: 2014-01-22 | Disposition: A | Discharge status: Home / Self Care | Payer: BC Managed Care – PPO | Source: Ambulatory Visit | Attending: Family Medicine | Admitting: Family Medicine

## 2014-01-22 DIAGNOSIS — Z1231 Encounter for screening mammogram for malignant neoplasm of breast: Secondary | ICD-10-CM | POA: Diagnosis present

## 2014-05-12 ENCOUNTER — Encounter: Payer: Self-pay | Admitting: Family Medicine

## 2014-05-12 ENCOUNTER — Ambulatory Visit (INDEPENDENT_AMBULATORY_CARE_PROVIDER_SITE_OTHER): Payer: BLUE CROSS/BLUE SHIELD | Admitting: Family Medicine

## 2014-05-12 VITALS — BP 128/64 | HR 66 | Temp 97.8°F | Resp 14 | Wt 148.0 lb

## 2014-05-12 DIAGNOSIS — R1032 Left lower quadrant pain: Secondary | ICD-10-CM

## 2014-05-12 DIAGNOSIS — R1031 Right lower quadrant pain: Secondary | ICD-10-CM

## 2014-05-12 LAB — CBC WITH DIFFERENTIAL/PLATELET
BASOS ABS: 0 10*3/uL (ref 0.0–0.1)
Basophils Relative: 1 % (ref 0–1)
Eosinophils Absolute: 0 10*3/uL (ref 0.0–0.7)
Eosinophils Relative: 1 % (ref 0–5)
HCT: 40.4 % (ref 36.0–46.0)
Hemoglobin: 13.8 g/dL (ref 12.0–15.0)
LYMPHS ABS: 1.4 10*3/uL (ref 0.7–4.0)
LYMPHS PCT: 31 % (ref 12–46)
MCH: 32.9 pg (ref 26.0–34.0)
MCHC: 34.2 g/dL (ref 30.0–36.0)
MCV: 96.4 fL (ref 78.0–100.0)
MONO ABS: 0.4 10*3/uL (ref 0.1–1.0)
MONOS PCT: 9 % (ref 3–12)
MPV: 10 fL (ref 8.6–12.4)
NEUTROS ABS: 2.6 10*3/uL (ref 1.7–7.7)
Neutrophils Relative %: 58 % (ref 43–77)
Platelets: 253 10*3/uL (ref 150–400)
RBC: 4.19 MIL/uL (ref 3.87–5.11)
RDW: 13 % (ref 11.5–15.5)
WBC: 4.5 10*3/uL (ref 4.0–10.5)

## 2014-05-12 LAB — URINALYSIS, ROUTINE W REFLEX MICROSCOPIC
Bilirubin Urine: NEGATIVE
GLUCOSE, UA: NEGATIVE mg/dL
Ketones, ur: NEGATIVE mg/dL
NITRITE: NEGATIVE
PH: 6.5 (ref 5.0–8.0)
Protein, ur: NEGATIVE mg/dL
SPECIFIC GRAVITY, URINE: 1.01 (ref 1.005–1.030)
Urobilinogen, UA: 0.2 mg/dL (ref 0.0–1.0)

## 2014-05-12 LAB — COMPREHENSIVE METABOLIC PANEL
ALBUMIN: 4.1 g/dL (ref 3.5–5.2)
ALK PHOS: 71 U/L (ref 39–117)
ALT: 27 U/L (ref 0–35)
AST: 30 U/L (ref 0–37)
BUN: 15 mg/dL (ref 6–23)
CO2: 26 mEq/L (ref 19–32)
Calcium: 10 mg/dL (ref 8.4–10.5)
Chloride: 106 mEq/L (ref 96–112)
Creat: 0.8 mg/dL (ref 0.50–1.10)
Glucose, Bld: 129 mg/dL — ABNORMAL HIGH (ref 70–99)
POTASSIUM: 4.4 meq/L (ref 3.5–5.3)
SODIUM: 140 meq/L (ref 135–145)
TOTAL PROTEIN: 7.3 g/dL (ref 6.0–8.3)
Total Bilirubin: 0.2 mg/dL (ref 0.2–1.2)

## 2014-05-12 LAB — URINALYSIS, MICROSCOPIC ONLY
BACTERIA UA: NONE SEEN
CASTS: NONE SEEN
Crystals: NONE SEEN

## 2014-05-12 LAB — LIPASE: LIPASE: 38 U/L (ref 0–75)

## 2014-05-12 MED ORDER — CIPROFLOXACIN HCL 500 MG PO TABS: 500.0000 mg | ORAL_TABLET | Freq: Two times a day (BID) | ORAL | Status: DC

## 2014-05-12 MED ORDER — HYDROCODONE-ACETAMINOPHEN 5-325 MG PO TABS: 1.0000 | ORAL_TABLET | Freq: Four times a day (QID) | ORAL | Status: DC | PRN

## 2014-05-12 NOTE — Patient Instructions (Addendum)
We will call with lab and results CT scan to be done today Pain medication as needed I will let you know when to start antibiotics

## 2014-05-12 NOTE — Progress Notes (Signed)
Patient ID: Ashley [name], female   DOB: 01/09/1966, 49 y.o.   MRN: [id number]   Subjective:    Patient ID: Ashley FeltLori N Raker, female    DOB: 08/17/1965, 49 y.o.   MRN: [id number]  Patient presents for Lower Abdominal/ Lower Back Pain  patient here with bilateral lower abdominal pain that radiates to her back on both sides. The pain was intermittent about 3 weeks ago but has been progressively worsening now it is daily where she feels like she is doubling over in pain. She's had some mild nausea associated. She denies any dysuria or urinary pressure is been no change in her bowels. She did increase her fiber in her diet over the past month but otherwise no other changes. She's not had any fever. No recent illness. No blood in his stool or her urine. She is not taking any over-the-counter medicine for the pain. The pain typically hits her in waves    Review Of Systems:  GEN- denies fatigue, fever, weight loss,weakness, recent illness HEENT- denies eye drainage, change in vision, nasal discharge, CVS- denies chest pain, palpitations RESP- denies SOB, cough, wheeze ABD- denies N/V, change in stools, +abd pain GU- denies dysuria, hematuria, dribbling, incontinence MSK- + joint pain, muscle aches, injury Neuro- denies headache, dizziness, syncope, seizure activity       Objective:    BP 128/64 mmHg  Pulse 66  Temp(Src) 97.8 F (36.6 C) (Oral)  Resp 14  Wt 148 lb (67.132 kg) GEN- NAD, alert and oriented x3 HEENT- PERRL, EOMI, non injected sclera, pink conjunctiva, MMM, oropharynx clear Neck- Supple, no LAD CVS- RRR, no murmur RESP-CTAB ABD-NABS,soft,TTP Lower quadrants, no rebound, no mass palpated, +gaurding, Right CVA tenderness MSK- Spine NT EXT- No edema Pulses- Radial 2+        Assessment & Plan:      Problem List Items Addressed This Visit    None    Visit Diagnoses    Bilateral lower abdominal pain    -  Primary    Unclear cause, evaualte bowels, UA shows trace leukocytes  but not classic of pyelonephritis, pain not consistant with kidney stones. CT abd pelvis, Start Cipro, urine culture sent Norco given    Relevant Orders    Urinalysis, Routine w reflex microscopic (Completed)    CBC with Differential/Platelet    Comprehensive metabolic panel    Lipase    Urine culture    CT Abdomen Pelvis Wo Contrast       Note: This dictation was prepared with Dragon dictation along with smaller phrase technology. Any transcriptional errors that result from this process are unintentional.

## 2014-05-13 ENCOUNTER — Ambulatory Visit (HOSPITAL_COMMUNITY)
Admission: RE | Admit: 2014-05-13 | Discharge: 2014-05-13 | Disposition: A | Discharge status: Home / Self Care | Payer: BLUE CROSS/BLUE SHIELD | Source: Ambulatory Visit | Attending: Family Medicine | Admitting: Family Medicine

## 2014-05-13 DIAGNOSIS — R1032 Left lower quadrant pain: Secondary | ICD-10-CM

## 2014-05-13 DIAGNOSIS — Z90710 Acquired absence of both cervix and uterus: Secondary | ICD-10-CM | POA: Insufficient documentation

## 2014-05-13 DIAGNOSIS — R102 Pelvic and perineal pain: Secondary | ICD-10-CM | POA: Diagnosis not present

## 2014-05-13 DIAGNOSIS — R1031 Right lower quadrant pain: Secondary | ICD-10-CM

## 2014-05-13 LAB — URINE CULTURE
Colony Count: NO GROWTH
Organism ID, Bacteria: NO GROWTH

## 2015-01-07 ENCOUNTER — Other Ambulatory Visit: Payer: Self-pay | Admitting: Family Medicine

## 2015-01-07 DIAGNOSIS — Z1231 Encounter for screening mammogram for malignant neoplasm of breast: Secondary | ICD-10-CM

## 2015-01-23 ENCOUNTER — Ambulatory Visit (INDEPENDENT_AMBULATORY_CARE_PROVIDER_SITE_OTHER): Payer: BLUE CROSS/BLUE SHIELD | Admitting: Family Medicine

## 2015-01-23 ENCOUNTER — Encounter: Payer: Self-pay | Admitting: Family Medicine

## 2015-01-23 VITALS — BP 122/70 | HR 68 | Temp 98.2°F | Resp 12 | Ht 63.0 in | Wt 159.0 lb

## 2015-01-23 DIAGNOSIS — F411 Generalized anxiety disorder: Secondary | ICD-10-CM | POA: Diagnosis not present

## 2015-01-23 DIAGNOSIS — N3080 Other cystitis without hematuria: Secondary | ICD-10-CM | POA: Diagnosis not present

## 2015-01-23 DIAGNOSIS — G4700 Insomnia, unspecified: Secondary | ICD-10-CM | POA: Diagnosis not present

## 2015-01-23 DIAGNOSIS — Z0000 Encounter for general adult medical examination without abnormal findings: Secondary | ICD-10-CM | POA: Diagnosis not present

## 2015-01-23 DIAGNOSIS — N3090 Cystitis, unspecified without hematuria: Secondary | ICD-10-CM

## 2015-01-23 DIAGNOSIS — Z1321 Encounter for screening for nutritional disorder: Secondary | ICD-10-CM | POA: Diagnosis not present

## 2015-01-23 DIAGNOSIS — E785 Hyperlipidemia, unspecified: Secondary | ICD-10-CM | POA: Diagnosis not present

## 2015-01-23 LAB — CBC WITH DIFFERENTIAL/PLATELET
BASOS ABS: 0 10*3/uL (ref 0.0–0.1)
Basophils Relative: 1 % (ref 0–1)
EOS ABS: 0.1 10*3/uL (ref 0.0–0.7)
Eosinophils Relative: 3 % (ref 0–5)
HEMATOCRIT: 41.4 % (ref 36.0–46.0)
Hemoglobin: 13.6 g/dL (ref 12.0–15.0)
LYMPHS PCT: 42 % (ref 12–46)
Lymphs Abs: 1.8 10*3/uL (ref 0.7–4.0)
MCH: 31.5 pg (ref 26.0–34.0)
MCHC: 32.9 g/dL (ref 30.0–36.0)
MCV: 95.8 fL (ref 78.0–100.0)
MPV: 10 fL (ref 8.6–12.4)
Monocytes Absolute: 0.4 10*3/uL (ref 0.1–1.0)
Monocytes Relative: 10 % (ref 3–12)
Neutro Abs: 1.9 10*3/uL (ref 1.7–7.7)
Neutrophils Relative %: 44 % (ref 43–77)
PLATELETS: 265 10*3/uL (ref 150–400)
RBC: 4.32 MIL/uL (ref 3.87–5.11)
RDW: 13 % (ref 11.5–15.5)
WBC: 4.4 10*3/uL (ref 4.0–10.5)

## 2015-01-23 LAB — LIPID PANEL
Cholesterol: 240 mg/dL — ABNORMAL HIGH (ref 125–200)
HDL: 69 mg/dL (ref >=46)
LDL Cholesterol: 150 mg/dL — ABNORMAL HIGH (ref <130)
TRIGLYCERIDES: 105 mg/dL (ref <150)
Total CHOL/HDL Ratio: 3.5 Ratio (ref <=5.0)
VLDL: 21 mg/dL (ref <30)

## 2015-01-23 LAB — COMPREHENSIVE METABOLIC PANEL
ALT: 28 U/L (ref 6–29)
AST: 25 U/L (ref 10–35)
Albumin: 4.1 g/dL (ref 3.6–5.1)
Alkaline Phosphatase: 75 U/L (ref 33–115)
BILIRUBIN TOTAL: 0.4 mg/dL (ref 0.2–1.2)
BUN: 13 mg/dL (ref 7–25)
CO2: 25 mmol/L (ref 20–31)
Calcium: 9.3 mg/dL (ref 8.6–10.2)
Chloride: 104 mmol/L (ref 98–110)
Creat: 0.62 mg/dL (ref 0.50–1.10)
GLUCOSE: 108 mg/dL — AB (ref 70–99)
Potassium: 4.4 mmol/L (ref 3.5–5.3)
Sodium: 139 mmol/L (ref 135–146)
Total Protein: 6.9 g/dL (ref 6.1–8.1)

## 2015-01-23 LAB — URINALYSIS, ROUTINE W REFLEX MICROSCOPIC
Bilirubin Urine: NEGATIVE
GLUCOSE, UA: NEGATIVE
KETONES UR: NEGATIVE
Leukocytes, UA: NEGATIVE
NITRITE: NEGATIVE
PH: 6.5 (ref 5.0–8.0)
Protein, ur: NEGATIVE
SPECIFIC GRAVITY, URINE: 1.015 (ref 1.001–1.035)

## 2015-01-23 LAB — URINALYSIS, MICROSCOPIC ONLY
CASTS: NONE SEEN [LPF]
CRYSTALS: NONE SEEN [HPF]
WBC, UA: NONE SEEN WBC/HPF (ref <=5)
Yeast: NONE SEEN [HPF]

## 2015-01-23 MED ORDER — LORAZEPAM 0.5 MG PO TABS: 0.5000 mg | ORAL_TABLET | Freq: Two times a day (BID) | ORAL | Status: DC | PRN

## 2015-01-23 NOTE — Progress Notes (Signed)
Patient ID: Ashley FeltLori N Kuklinski, female   DOB: 06/01/1965, 49 y.o.   MRN: [id number]   Subjective:    Patient ID: Ashley [name], female    DOB: 10/14/1965, 49 y.o.   MRN: [id number]  Patient presents for CPE  S/P hysterecomty no PAP needed Colonoscopy Age 49  Flu shot done, TDAP UTD  Due for fasting labs, history of hyperlipidemia. Has gained 10lbs since March 2016.   She has been having daily episodes of bladder pressure and irritation, feels like UTI symptoms all the time. Has been treated a few times this year. No change in bowel movements, no blood in urine. Started taking cranberry tablets which gives some relief   Continues to use, ativan as needed, had 4 deaths in family past few months, stress at work with changes in work schedule, she states everything "just got off". She had not been working out,taking vitamins or watching diet and has gained weight. In the past 2 weeks things are settling out and she is back on vitamins. She does take 1/2 tab of ativan as needed  Review Of Systems:  GEN- denies fatigue, fever, weight loss,weakness, recent illness HEENT- denies eye drainage, change in vision, nasal discharge, CVS- denies chest pain, palpitations RESP- denies SOB, cough, wheeze ABD- denies N/V, change in stools, abd pain GU- +dysuria, hematuria, dribbling, incontinence MSK- denies joint pain, muscle aches, injury Neuro- denies headache, dizziness, syncope, seizure activity       Objective:    BP 122/70 mmHg  Pulse 68  Temp(Src) 98.2 F (36.8 C) (Oral)  Resp 12  Ht 5\' 3"  (1.6 m)  Wt 159 lb (72.122 kg)  BMI 28.17 kg/m2 GEN- NAD, alert and oriented x3 HEENT- PERRL, EOMI, non injected sclera, pink conjunctiva, MMM, oropharynx clear Neck- Supple, no thyromegaly CVS- RRR, no murmur RESP-CTAB ABD-NABS,soft,NT,ND, no CVA tenderness Psych- normal affect and mood  EXT- No edema Pulses- Radial, DP- 2+        Assessment & Plan:      Problem List Items Addressed This Visit    PE  (physical exam), annual - Primary    CPE done, fasting labs, mammo scheduled. Immunizations UTD      Relevant Orders   CBC with Differential/Platelet   Comprehensive metabolic panel   Insomnia   Hyperlipidemia   Relevant Orders   Comprehensive metabolic panel   Lipid panel   Anxiety state    Insomnia and anxiety with family death, shift work, will continue ativan as needed       Other Visit Diagnoses    Encounter for vitamin deficiency screening        Relevant Orders    Vitamin D, 25-hydroxy    Recurrent cystitis        refer to urology, evalute for bladder prolpase as well s/p hysterectomy, daily symptoms on cranberry, check UA/culture today    Relevant Orders    Urinalysis, Routine w reflex microscopic (not [facility])    Urine culture       Note: This dictation was prepared with Dragon dictation along with smaller phrase technology. Any transcriptional errors that result from this process are unintentional.

## 2015-01-23 NOTE — Assessment & Plan Note (Signed)
CPE done, fasting labs, mammo scheduled. Immunizations UTD

## 2015-01-23 NOTE — Assessment & Plan Note (Signed)
Insomnia and anxiety with family death, shift work, will continue ativan as needed

## 2015-01-23 NOTE — Patient Instructions (Addendum)
I recommend eye visit once a year I recommend dental visit every 6 months Goal is to  Exercise 30 minutes 5 days a week Urology referral  We will send a letter with lab results  F/U 1 year or as needed

## 2015-01-24 LAB — VITAMIN D 25 HYDROXY (VIT D DEFICIENCY, FRACTURES): Vit D, 25-Hydroxy: 18 ng/mL — ABNORMAL LOW (ref 30–100)

## 2015-01-25 LAB — URINE CULTURE

## 2015-01-26 ENCOUNTER — Ambulatory Visit (HOSPITAL_COMMUNITY)
Admission: RE | Admit: 2015-01-26 | Discharge: 2015-01-26 | Disposition: A | Discharge status: Home / Self Care | Payer: BLUE CROSS/BLUE SHIELD | Source: Ambulatory Visit | Attending: Family Medicine | Admitting: Family Medicine

## 2015-01-26 ENCOUNTER — Ambulatory Visit (HOSPITAL_COMMUNITY): Payer: Self-pay

## 2015-01-26 DIAGNOSIS — Z1231 Encounter for screening mammogram for malignant neoplasm of breast: Secondary | ICD-10-CM | POA: Diagnosis present

## 2015-01-27 ENCOUNTER — Other Ambulatory Visit: Payer: Self-pay | Admitting: *Deleted

## 2015-01-27 MED ORDER — VITAMIN D (ERGOCALCIFEROL) 1.25 MG (50000 UNIT) PO CAPS: ORAL_CAPSULE | ORAL | Status: DC

## 2015-03-04 ENCOUNTER — Ambulatory Visit (INDEPENDENT_AMBULATORY_CARE_PROVIDER_SITE_OTHER): Payer: BLUE CROSS/BLUE SHIELD | Admitting: Urology

## 2015-03-04 DIAGNOSIS — R102 Pelvic and perineal pain: Secondary | ICD-10-CM | POA: Diagnosis not present

## 2015-03-04 DIAGNOSIS — K5909 Other constipation: Secondary | ICD-10-CM

## 2015-09-16 ENCOUNTER — Ambulatory Visit (INDEPENDENT_AMBULATORY_CARE_PROVIDER_SITE_OTHER): Payer: BLUE CROSS/BLUE SHIELD | Admitting: Family Medicine

## 2015-09-16 ENCOUNTER — Encounter: Payer: Self-pay | Admitting: Family Medicine

## 2015-09-16 VITALS — BP 130/72 | HR 88 | Temp 98.3°F | Resp 12 | Ht 62.0 in | Wt 171.0 lb

## 2015-09-16 DIAGNOSIS — K5900 Constipation, unspecified: Secondary | ICD-10-CM

## 2015-09-16 DIAGNOSIS — E663 Overweight: Secondary | ICD-10-CM | POA: Insufficient documentation

## 2015-09-16 DIAGNOSIS — F411 Generalized anxiety disorder: Secondary | ICD-10-CM

## 2015-09-16 DIAGNOSIS — E669 Obesity, unspecified: Secondary | ICD-10-CM

## 2015-09-16 MED ORDER — DIAZEPAM 2 MG PO TABS: 2.0000 mg | ORAL_TABLET | Freq: Every evening | ORAL | Status: DC | PRN

## 2015-09-16 MED ORDER — LINACLOTIDE 145 MCG PO CAPS: 145.0000 ug | ORAL_CAPSULE | Freq: Every day | ORAL | Status: DC

## 2015-09-16 MED ORDER — LORAZEPAM 0.5 MG PO TABS: 0.5000 mg | ORAL_TABLET | Freq: Two times a day (BID) | ORAL | Status: DC | PRN

## 2015-09-16 NOTE — Progress Notes (Signed)
Patient ID: Ashley [name], female   DOB: 09/19/1965, 50 y.o.   MRN: [id number]   Subjective:    Patient ID: Ashley FeltLori N [name], female    DOB: 02/21/1966, 50 y.o.   MRN: [id number]  Patient presents for Medication Review History here to discuss medications. She was seen by urology for the recurring. Tract infection she's found to have pelvic floor dysfunction or bladder tack was in place. 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 She also still uses lorazepam as needed during the daytime. She's not had a urine tract infection the past 6 months  She was started on MiraLAX secondary to constipation also noted this is not helping to completely empty her bowels to typically have very small bowels throughout the day she will like to try something different.  He is worried about her weight she has gained 11 pounds since her last visit in December she has had a change in her shift but is still trying to watch her calorie eats somewhere between 1200 1400 calories    Review Of Systems:  GEN- denies fatigue, fever, weight loss,weakness, recent illness HEENT- denies eye drainage, change in vision, nasal discharge, CVS- denies chest pain, palpitations RESP- denies SOB, cough, wheeze ABD- denies N/V, change in stools, abd pain GU- denies dysuria, hematuria, dribbling, incontinence MSK- denies joint pain, muscle aches, injury Neuro- denies headache, dizziness, syncope, seizure activity       Objective:    BP 130/72 mmHg  Pulse 88  Temp(Src) 98.3 F (36.8 C) (Oral)  Resp 12  Ht 5\' 2"  (1.575 m)  Wt 171 lb (77.565 kg)  BMI 31.27 kg/m2 GEN- NAD, alert and oriented x3 HEENT- PERRL, EOMI, non injected sclera, pink conjunctiva, MMM, oropharynx clear Neck- Supple, no thyromegaly CVS- RRR, no murmur RESP-CTAB ABD-NABS,soft,NT,ND Psych normal affect and mood  EXT- No  edema Pulses- Radial, DP- 2+        Assessment & Plan:      Problem List Items Addressed This Visit    Obesity - Primary      Note: This dictation was prepared with Dragon dictation along with smaller phrase technology. Any transcriptional errors that result from this process are unintentional.

## 2015-09-16 NOTE — Patient Instructions (Signed)
Linzess once a day instead of miralax  Take Valium only at bedside  Ativan as needed Work on diet F/U 2 months for recheck

## 2015-09-17 ENCOUNTER — Encounter: Payer: Self-pay | Admitting: Family Medicine

## 2015-09-17 DIAGNOSIS — K5900 Constipation, unspecified: Secondary | ICD-10-CM | POA: Insufficient documentation

## 2015-09-17 NOTE — Assessment & Plan Note (Signed)
Trial of linzess, given samples

## 2015-09-17 NOTE — Assessment & Plan Note (Signed)
Given handouts on mediterran diet and healthy food list  Work on exerise as well

## 2015-09-17 NOTE — Assessment & Plan Note (Signed)
She is using valium for primary pelvic floor dysfunction, which is coincidental with her anxiety. Will use valium once a day before bedtime Ativan can be used once a day during daytime hours if needed

## 2015-09-22 ENCOUNTER — Encounter: Payer: Self-pay | Admitting: Family Medicine

## 2015-09-23 MED ORDER — LINACLOTIDE 145 MCG PO CAPS: 145.0000 ug | ORAL_CAPSULE | Freq: Every day | ORAL | Status: DC

## 2015-09-23 NOTE — Telephone Encounter (Signed)
Medication called/sent to requested pharmacy and pt aware via mychart message

## 2015-11-17 ENCOUNTER — Telehealth: Payer: Self-pay

## 2015-11-17 ENCOUNTER — Encounter: Payer: Self-pay | Admitting: Family Medicine

## 2015-11-17 ENCOUNTER — Ambulatory Visit (INDEPENDENT_AMBULATORY_CARE_PROVIDER_SITE_OTHER): Payer: BLUE CROSS/BLUE SHIELD | Admitting: Family Medicine

## 2015-11-17 VITALS — BP 124/74 | Temp 98.0°F | Resp 14 | Wt 162.0 lb

## 2015-11-17 DIAGNOSIS — N8184 Pelvic muscle wasting: Secondary | ICD-10-CM | POA: Diagnosis not present

## 2015-11-17 DIAGNOSIS — M6289 Other specified disorders of muscle: Secondary | ICD-10-CM

## 2015-11-17 DIAGNOSIS — E663 Overweight: Secondary | ICD-10-CM

## 2015-11-17 DIAGNOSIS — F411 Generalized anxiety disorder: Secondary | ICD-10-CM

## 2015-11-17 DIAGNOSIS — K5900 Constipation, unspecified: Secondary | ICD-10-CM | POA: Diagnosis not present

## 2015-11-17 MED ORDER — DIAZEPAM 2 MG PO TABS: 2.0000 mg | ORAL_TABLET | Freq: Every evening | ORAL | 3 refills | Status: DC | PRN

## 2015-11-17 MED ORDER — LORAZEPAM 0.5 MG PO TABS: 0.5000 mg | ORAL_TABLET | Freq: Two times a day (BID) | ORAL | 2 refills | Status: DC | PRN

## 2015-11-17 MED ORDER — LINACLOTIDE 145 MCG PO CAPS: 145.0000 ug | ORAL_CAPSULE | Freq: Every day | ORAL | 3 refills | Status: DC

## 2015-11-17 NOTE — Assessment & Plan Note (Signed)
She continues to work on dietary changes. Her weight is improved 10 pounds which is excellent. She is no longer obese

## 2015-11-17 NOTE — Telephone Encounter (Signed)
RX called in .

## 2015-11-17 NOTE — Progress Notes (Signed)
   Subjective:    Patient ID: Ashley [name], female    DOB: 02/02/1966, 50 y.o.   MRN: [id number]  Patient presents for Follow-up Patient here to follow-up medications. Her last visit she was started on Linzess  for her constipation she states that this medication has worked great for her. She would like to continue.  With regards to her pelvic floor dysfunction her anxiety we are currently using Valium at bedtime which was prescribed by urology she uses her lorazepam only as needed during the day for anxiety and this seems to be working well for her. She feels more relaxed she is sleeping well she's been working on her diet and her weight is down 10 pounds by her home scale. She has no new concerns today. She will get her flu shot at work    Review Of Systems:  GEN- denies fatigue, fever, weight loss,weakness, recent illness HEENT- denies eye drainage, change in vision, nasal discharge, CVS- denies chest pain, palpitations RESP- denies SOB, cough, wheeze ABD- denies N/V, change in stools, abd pain GU- denies dysuria, hematuria, dribbling, incontinence MSK- denies joint pain, muscle aches, injury Neuro- denies headache, dizziness, syncope, seizure activity       Objective:    BP 124/74 (BP Location: Right Arm, Patient Position: Sitting, Cuff Size: Normal)   Temp 98 F (36.7 C) (Oral)   Resp 14   Wt 162 lb (73.5 kg)   BMI 29.63 kg/m  GEN- NAD, alert and oriented x3 HEENT- PERRL, EOMI, non injected sclera, pink conjunctiva, MMM, oropharynx clear CVS- RRR, no murmur RESP-CTAB ABD-NABS,soft,NT,ND pSYCH- Normal affect and mood          Assessment & Plan:      Problem List Items Addressed This Visit    Pelvic floor dysfunction   Overweight    She continues to work on dietary changes. Her weight is improved 10 pounds which is excellent. She is no longer obese      Constipation    Continue linzess      Anxiety state    Continue current regimen of Valium at bedtime for  the pelvic floor dysfunction and lorazepam as needed during the day      Relevant Medications   LORazepam (ATIVAN) 0.5 MG tablet   diazepam (VALIUM) 2 MG tablet    Other Visit Diagnoses   None.     Note: This dictation was prepared with Dragon dictation along with smaller phrase technology. Any transcriptional errors that result from this process are unintentional.

## 2015-11-17 NOTE — Assessment & Plan Note (Signed)
Continue linzess 

## 2015-11-17 NOTE — Patient Instructions (Addendum)
Continue current medications Get flu shot at work  F/U 3 months for Physical

## 2015-11-17 NOTE — Assessment & Plan Note (Signed)
Continue current regimen of Valium at bedtime for the pelvic floor dysfunction and lorazepam as needed during the day

## 2016-01-27 ENCOUNTER — Other Ambulatory Visit: Payer: Self-pay | Admitting: Family Medicine

## 2016-01-27 DIAGNOSIS — Z1231 Encounter for screening mammogram for malignant neoplasm of breast: Secondary | ICD-10-CM

## 2016-02-17 ENCOUNTER — Ambulatory Visit (INDEPENDENT_AMBULATORY_CARE_PROVIDER_SITE_OTHER): Payer: BLUE CROSS/BLUE SHIELD | Admitting: Family Medicine

## 2016-02-17 ENCOUNTER — Encounter: Payer: Self-pay | Admitting: Family Medicine

## 2016-02-17 ENCOUNTER — Ambulatory Visit (HOSPITAL_COMMUNITY)
Admission: RE | Admit: 2016-02-17 | Discharge: 2016-02-17 | Disposition: A | Discharge status: Home / Self Care | Payer: BLUE CROSS/BLUE SHIELD | Source: Ambulatory Visit | Attending: Family Medicine | Admitting: Family Medicine

## 2016-02-17 VITALS — BP 122/72 | HR 88 | Temp 98.7°F | Resp 14 | Ht 62.0 in | Wt 161.0 lb

## 2016-02-17 DIAGNOSIS — Z0000 Encounter for general adult medical examination without abnormal findings: Secondary | ICD-10-CM

## 2016-02-17 DIAGNOSIS — Z1321 Encounter for screening for nutritional disorder: Secondary | ICD-10-CM | POA: Diagnosis not present

## 2016-02-17 DIAGNOSIS — F5101 Primary insomnia: Secondary | ICD-10-CM

## 2016-02-17 DIAGNOSIS — Z1231 Encounter for screening mammogram for malignant neoplasm of breast: Secondary | ICD-10-CM

## 2016-02-17 DIAGNOSIS — R12 Heartburn: Secondary | ICD-10-CM | POA: Diagnosis not present

## 2016-02-17 DIAGNOSIS — E7800 Pure hypercholesterolemia, unspecified: Secondary | ICD-10-CM | POA: Diagnosis not present

## 2016-02-17 MED ORDER — DIAZEPAM 2 MG PO TABS: 2.0000 mg | ORAL_TABLET | Freq: Every evening | ORAL | 1 refills | Status: DC | PRN

## 2016-02-17 MED ORDER — LINACLOTIDE 145 MCG PO CAPS: 145.0000 ug | ORAL_CAPSULE | Freq: Every day | ORAL | 3 refills | Status: DC

## 2016-02-17 NOTE — Assessment & Plan Note (Signed)
CPE , mammogram today Fasting labs today Immunizations UTD Will schedule colonoscopy age 50

## 2016-02-17 NOTE — Assessment & Plan Note (Signed)
Continue valium at bedtime for sleep Ativan for more the pelvic floor problem, does not use very often

## 2016-02-17 NOTE — Assessment & Plan Note (Signed)
Heartburn recently started on prilosec will continue for now, she can try to back down to as needed after the 2 week course

## 2016-02-17 NOTE — Patient Instructions (Signed)
F/U 6 months

## 2016-02-17 NOTE — Progress Notes (Signed)
   Subjective:    Patient ID: Ashley [name], female    DOB: 07/18/1965, 50 y.o.   MRN: [id number]  Patient presents for CPE (is fasting)  Pt here for CPE Mammogram- done today  Colonoscopy- age 89, she will call after birthday to get this set up  Hysterectomy no PAP needed  Immunizations UTD Due for fasting labs Meds and history reviewed No concerns today  She has had some hearburn past few months has been using a lot of tums, discussed with pharmacist started prilosec 20mg  once a day  On Monday , symptoms resolved    Review Of Systems:  GEN- denies fatigue, fever, weight loss,weakness, recent illness HEENT- denies eye drainage, change in vision, nasal discharge, CVS- denies chest pain, palpitations RESP- denies SOB, cough, wheeze ABD- denies N/V, change in stools, abd pain GU- denies dysuria, hematuria, dribbling, incontinence MSK- denies joint pain, muscle aches, injury Neuro- denies headache, dizziness, syncope, seizure activity       Objective:    BP 122/72 (BP Location: Right Arm, Patient Position: Sitting, Cuff Size: Large)   Pulse 88   Temp 98.7 F (37.1 C) (Oral)   Resp 14   Ht 5\' 2"  (1.575 m)   Wt 161 lb (73 kg)   SpO2 98%   BMI 29.45 kg/m  GEN- NAD, alert and oriented x3 HEENT- PERRL, EOMI, non injected sclera, pink conjunctiva, MMM, oropharynx clear, TM clear bilat no effusion  Neck- Supple, no thyromegaly CVS- RRR, no murmur RESP-CTAB ABD-NABS,soft,NT,ND EXT- No edema Pulses- Radial, DP- 2+        Assessment & Plan:      Problem List Items Addressed This Visit    PE (physical exam), annual - Primary    CPE , mammogram today Fasting labs today Immunizations UTD Will schedule colonoscopy age 50      Relevant Orders   CBC with Differential/Platelet   Comprehensive metabolic panel   Lipid panel   TSH   Insomnia    Continue valium at bedtime for sleep Ativan for more the pelvic floor problem, does not use very often       Hyperlipidemia    Relevant Orders   Lipid panel   Heartburn    Heartburn recently started on prilosec will continue for now, she can try to back down to as needed after the 2 week course       Other Visit Diagnoses    Encounter for vitamin deficiency screening       Relevant Orders   Vitamin D, 25-hydroxy      Note: This dictation was prepared with Dragon dictation along with smaller phrase technology. Any transcriptional errors that result from this process are unintentional.

## 2016-02-18 ENCOUNTER — Encounter: Payer: Self-pay | Admitting: Family Medicine

## 2016-02-18 LAB — LIPID PANEL
CHOLESTEROL: 209 mg/dL — AB (ref <200)
HDL: 62 mg/dL (ref >50)
LDL Cholesterol: 128 mg/dL — ABNORMAL HIGH (ref <100)
TRIGLYCERIDES: 95 mg/dL (ref <150)
Total CHOL/HDL Ratio: 3.4 Ratio (ref <5.0)
VLDL: 19 mg/dL (ref <30)

## 2016-02-18 LAB — COMPREHENSIVE METABOLIC PANEL
ALBUMIN: 4.1 g/dL (ref 3.6–5.1)
ALK PHOS: 88 U/L (ref 33–115)
ALT: 28 U/L (ref 6–29)
AST: 26 U/L (ref 10–35)
BILIRUBIN TOTAL: 0.3 mg/dL (ref 0.2–1.2)
BUN: 12 mg/dL (ref 7–25)
CHLORIDE: 103 mmol/L (ref 98–110)
CO2: 28 mmol/L (ref 20–31)
CREATININE: 0.75 mg/dL (ref 0.50–1.10)
Calcium: 9.2 mg/dL (ref 8.6–10.2)
Glucose, Bld: 96 mg/dL (ref 70–99)
Potassium: 4.6 mmol/L (ref 3.5–5.3)
SODIUM: 139 mmol/L (ref 135–146)
TOTAL PROTEIN: 7.1 g/dL (ref 6.1–8.1)

## 2016-02-18 LAB — CBC WITH DIFFERENTIAL/PLATELET
BASOS ABS: 0 {cells}/uL (ref 0–200)
Basophils Relative: 0 %
EOS ABS: 102 {cells}/uL (ref 15–500)
Eosinophils Relative: 2 %
HEMATOCRIT: 39.2 % (ref 35.0–45.0)
HEMOGLOBIN: 13.3 g/dL (ref 12.0–15.0)
LYMPHS ABS: 1938 {cells}/uL (ref 850–3900)
Lymphocytes Relative: 38 %
MCH: 32.4 pg (ref 27.0–33.0)
MCHC: 33.9 g/dL (ref 32.0–36.0)
MCV: 95.6 fL (ref 80.0–100.0)
MONO ABS: 459 {cells}/uL (ref 200–950)
MPV: 9.8 fL (ref 7.5–12.5)
Monocytes Relative: 9 %
NEUTROS ABS: 2601 {cells}/uL (ref 1500–7800)
Neutrophils Relative %: 51 %
Platelets: 263 10*3/uL (ref 140–400)
RBC: 4.1 MIL/uL (ref 3.80–5.10)
RDW: 12.8 % (ref 11.0–15.0)
WBC: 5.1 10*3/uL (ref 3.8–10.8)

## 2016-02-18 LAB — VITAMIN D 25 HYDROXY (VIT D DEFICIENCY, FRACTURES): Vit D, 25-Hydroxy: 23 ng/mL — ABNORMAL LOW (ref 30–100)

## 2016-02-18 LAB — TSH: TSH: 1.68 mIU/L

## 2016-02-19 MED ORDER — LORAZEPAM 0.5 MG PO TABS: 0.5000 mg | ORAL_TABLET | Freq: Two times a day (BID) | ORAL | 0 refills | Status: DC | PRN

## 2016-05-20 ENCOUNTER — Telehealth: Payer: Self-pay | Admitting: *Deleted

## 2016-05-20 MED ORDER — LORAZEPAM 0.5 MG PO TABS: 0.5000 mg | ORAL_TABLET | Freq: Two times a day (BID) | ORAL | 0 refills | Status: DC | PRN

## 2016-05-20 NOTE — Telephone Encounter (Signed)
Prescription faxed

## 2016-05-20 NOTE — Telephone Encounter (Signed)
okay

## 2016-05-20 NOTE — Telephone Encounter (Signed)
Received fax requesting refill on Ativan via mail order.   Ok to refill??  Last office visit 02/17/2016.  Last refill 02/19/2016 via mail order.

## 2016-06-21 ENCOUNTER — Ambulatory Visit: Payer: BLUE CROSS/BLUE SHIELD | Admitting: Family Medicine

## 2016-07-05 ENCOUNTER — Ambulatory Visit: Payer: BLUE CROSS/BLUE SHIELD | Admitting: Family Medicine

## 2016-08-14 ENCOUNTER — Other Ambulatory Visit: Payer: Self-pay | Admitting: Family Medicine

## 2016-08-15 NOTE — Telephone Encounter (Signed)
Ok to refill both to mail order??  Last office visit 02/17/2016.  Last refill on Ativan 05/20/2016.  Last refill on Valium 02/16/2017, #1.

## 2016-08-15 NOTE — Telephone Encounter (Signed)
Okay to refill both.

## 2016-08-16 ENCOUNTER — Other Ambulatory Visit: Payer: Self-pay | Admitting: Family Medicine

## 2016-08-16 NOTE — Telephone Encounter (Signed)
Prescription faxed to mail order.

## 2016-08-18 ENCOUNTER — Other Ambulatory Visit: Payer: Self-pay | Admitting: Family Medicine

## 2016-08-19 ENCOUNTER — Other Ambulatory Visit: Payer: Self-pay | Admitting: Family Medicine

## 2016-08-21 ENCOUNTER — Ambulatory Visit (HOSPITAL_COMMUNITY)
Admission: EM | Admit: 2016-08-21 | Discharge: 2016-08-21 | Disposition: A | Discharge status: Home / Self Care | Payer: BLUE CROSS/BLUE SHIELD | Attending: Internal Medicine | Admitting: Internal Medicine

## 2016-08-21 ENCOUNTER — Encounter (HOSPITAL_COMMUNITY): Payer: Self-pay | Admitting: Emergency Medicine

## 2016-08-21 DIAGNOSIS — Z90710 Acquired absence of both cervix and uterus: Secondary | ICD-10-CM | POA: Insufficient documentation

## 2016-08-21 DIAGNOSIS — Z833 Family history of diabetes mellitus: Secondary | ICD-10-CM | POA: Diagnosis not present

## 2016-08-21 DIAGNOSIS — Z888 Allergy status to other drugs, medicaments and biological substances status: Secondary | ICD-10-CM | POA: Insufficient documentation

## 2016-08-21 DIAGNOSIS — Z9889 Other specified postprocedural states: Secondary | ICD-10-CM | POA: Insufficient documentation

## 2016-08-21 DIAGNOSIS — Z87891 Personal history of nicotine dependence: Secondary | ICD-10-CM | POA: Diagnosis not present

## 2016-08-21 DIAGNOSIS — M549 Dorsalgia, unspecified: Secondary | ICD-10-CM | POA: Insufficient documentation

## 2016-08-21 DIAGNOSIS — Z79899 Other long term (current) drug therapy: Secondary | ICD-10-CM | POA: Diagnosis not present

## 2016-08-21 DIAGNOSIS — R350 Frequency of micturition: Secondary | ICD-10-CM | POA: Diagnosis not present

## 2016-08-21 DIAGNOSIS — R300 Dysuria: Secondary | ICD-10-CM | POA: Diagnosis not present

## 2016-08-21 DIAGNOSIS — R109 Unspecified abdominal pain: Secondary | ICD-10-CM | POA: Diagnosis not present

## 2016-08-21 DIAGNOSIS — R1030 Lower abdominal pain, unspecified: Secondary | ICD-10-CM | POA: Insufficient documentation

## 2016-08-21 DIAGNOSIS — F419 Anxiety disorder, unspecified: Secondary | ICD-10-CM | POA: Insufficient documentation

## 2016-08-21 DIAGNOSIS — E785 Hyperlipidemia, unspecified: Secondary | ICD-10-CM | POA: Insufficient documentation

## 2016-08-21 LAB — POCT URINALYSIS DIP (DEVICE)
BILIRUBIN URINE: NEGATIVE
Glucose, UA: NEGATIVE mg/dL
HGB URINE DIPSTICK: NEGATIVE
KETONES UR: NEGATIVE mg/dL
LEUKOCYTES UA: NEGATIVE
Nitrite: NEGATIVE
PH: 6 (ref 5.0–8.0)
Protein, ur: NEGATIVE mg/dL
Urobilinogen, UA: 0.2 mg/dL (ref 0.0–1.0)

## 2016-08-21 MED ORDER — CEPHALEXIN 500 MG PO CAPS: 500.0000 mg | ORAL_CAPSULE | Freq: Four times a day (QID) | ORAL | 0 refills | Status: DC

## 2016-08-21 NOTE — ED Provider Notes (Signed)
CSN: [id number]     Arrival date & time 08/21/16  1212 History   None    Chief Complaint  Patient presents with  . Abdominal Pain  . Urinary Frequency   (Consider location/radiation/quality/duration/timing/severity/associated sxs/prior Treatment) Patient presents to [facility] with c/o lower abdominal pain and back pain and UA sx's.   The history is provided by the patient.  Abdominal Pain  Pain location:  Generalized Pain radiates to:  Periumbilical region Onset quality:  Sudden Timing:  Constant Urinary Frequency  Associated symptoms include abdominal pain.    Past Medical History:  Diagnosis Date  . Anxiety   . Hyperlipidemia    Past Surgical History:  Procedure Laterality Date  . ABDOMINAL HYSTERECTOMY     Endometriosis  . CARPAL TUNNEL RELEASE  2005   bilateral  . FOOT ARTHRODESIS Right 07/12/2012   Procedure: ARTHRODESIS FOOT FIRST  METATARSOPHALANGEAL JOINT;  Surgeon: Dallas Schimke, DPM;  Location: [facility];  Service: Orthopedics;  Laterality: Right;   Family History  Problem Relation Age of Onset  . Diabetes Father    Social History  Substance Use Topics  . Smoking status: Former Games developer  . Smokeless tobacco: Never Used  . Alcohol use Yes   OB History    No data available     Review of Systems  Constitutional: Negative.   HENT: Negative.   Eyes: Negative.   Respiratory: Negative.   Gastrointestinal: Positive for abdominal pain.  Endocrine: Negative.   Genitourinary: Positive for frequency.  Allergic/Immunologic: Negative.   Hematological: Negative.   Psychiatric/Behavioral: Negative.     Allergies  Bee venom and Statins  Home Medications   Prior to Admission medications   Medication Sig Start Date End Date Taking? Authorizing Provider  Calcium-Vitamin D 600-200 MG-UNIT per tablet Take 1 tablet by mouth 2 (two) times daily.      [provider]  cephALEXin (KEFLEX) 500 MG capsule Take 1 capsule (500 mg total) by mouth 4 (four) times  daily. 08/21/16   Deatra Canter, FNP  Cranberry 1000 MG CAPS Take by mouth.    [provider]  diazepam (VALIUM) 2 MG tablet TAKE 1 TABLET BY MOUTH AT BEDTIME AS NEEDED FOR ANXIETY 08/16/16   Salley Scarlet, MD  ibuprofen (ADVIL,MOTRIN) 200 MG tablet Take 400 mg by mouth once as needed. For pain     [provider]  linaclotide Karlene Einstein) 145 MCG CAPS capsule Take 1 capsule (145 mcg total) by mouth daily before breakfast. 02/17/16   Obion, Velna Hatchet, MD  LORazepam (ATIVAN) 0.5 MG tablet TAKE 1 TABLET BY MOUTH 2 TIMES DAILY AS NEEDED FOR ANXIETY. GENERIC EQUIVALENT FOR ATIVAN 08/16/16   Salley Scarlet, MD  Multiple Vitamin (MULTIVITAMIN) tablet Take 1 tablet by mouth daily.      [provider]  Omega-3 Fatty Acids (FISH OIL) 1200 MG CAPS Take 1,200 mg by mouth daily.    [provider]  omeprazole (PRILOSEC OTC) 20 MG tablet Take 20 mg by mouth daily.    [provider]   Meds Ordered and Administered this Visit  Medications - No data to display  BP 122/79 (BP Location: Left Arm)   Pulse 79   Temp 98.4 F (36.9 C) (Oral)   Resp 18   SpO2 98%  No data found.   Physical Exam  Constitutional: She appears well-developed and well-nourished.  HENT:  Head: Normocephalic and atraumatic.  Eyes: Conjunctivae and EOM are normal. Pupils are equal, round, and reactive to  light.  Neck: Normal range of motion. Neck supple.  Cardiovascular: Normal rate and regular rhythm.   Pulmonary/Chest: Effort normal.  Abdominal: There is tenderness.  Bladder tenderness  Nursing note and vitals reviewed.   Urgent Care Course     Procedures (including critical care time)  Labs Review Labs Reviewed  URINE CULTURE  POCT URINALYSIS DIP (DEVICE)    Imaging Review No results found.   Visual Acuity Review  Right Eye Distance:   Left Eye Distance:   Bilateral Distance:    Right Eye Near:   Left Eye Near:    Bilateral Near:         MDM    1. Dysuria    UA cx Keflex 500mg  one po qid x 7 days #28  Push po fluids, rest, tylenol and motrin otc prn as directed for fever, arthralgias, and myalgias.  Follow up prn if sx's continue or persist.    Deatra CanterOxford, William J, FNP 08/21/16 2120

## 2016-08-21 NOTE — ED Triage Notes (Signed)
[facility] a complaint of lower abdominal pain, lower back pain, off and on dysuria and urinary frequency x 10 days.

## 2016-08-22 ENCOUNTER — Other Ambulatory Visit: Payer: Self-pay | Admitting: Family Medicine

## 2016-08-23 ENCOUNTER — Other Ambulatory Visit: Payer: Self-pay | Admitting: Family Medicine

## 2016-08-23 LAB — URINE CULTURE: Culture: 50000 — AB

## 2016-08-23 MED ORDER — DIAZEPAM 2 MG PO TABS: 2.0000 mg | ORAL_TABLET | Freq: Every evening | ORAL | 0 refills | Status: DC | PRN

## 2016-08-23 MED ORDER — LORAZEPAM 0.5 MG PO TABS: 0.5000 mg | ORAL_TABLET | Freq: Two times a day (BID) | ORAL | 0 refills | Status: DC | PRN

## 2016-08-23 NOTE — Telephone Encounter (Signed)
Patient states that pharmacy has not received prescription.   Will re-fax.

## 2016-08-29 ENCOUNTER — Ambulatory Visit: Payer: BLUE CROSS/BLUE SHIELD | Admitting: Family Medicine

## 2016-11-16 ENCOUNTER — Other Ambulatory Visit: Payer: Self-pay | Admitting: Family Medicine

## 2016-11-16 NOTE — Telephone Encounter (Signed)
Give 1 refill she needs OV

## 2016-11-16 NOTE — Telephone Encounter (Signed)
Last OV 02/17/2016 Last refill Valium 6/19, Ativan 6/19 Okay to refill?

## 2016-11-17 NOTE — Telephone Encounter (Signed)
rx called in to pharmacy\/ letter sent via MyChart as well as mailed to sch an appointment

## 2016-12-06 ENCOUNTER — Ambulatory Visit (INDEPENDENT_AMBULATORY_CARE_PROVIDER_SITE_OTHER): Payer: BLUE CROSS/BLUE SHIELD | Admitting: Family Medicine

## 2016-12-06 ENCOUNTER — Encounter: Payer: Self-pay | Admitting: Family Medicine

## 2016-12-06 ENCOUNTER — Encounter (INDEPENDENT_AMBULATORY_CARE_PROVIDER_SITE_OTHER): Payer: Self-pay | Admitting: *Deleted

## 2016-12-06 VITALS — BP 128/80 | HR 78 | Temp 97.9°F | Resp 14 | Ht 62.0 in | Wt 160.0 lb

## 2016-12-06 DIAGNOSIS — F411 Generalized anxiety disorder: Secondary | ICD-10-CM | POA: Diagnosis not present

## 2016-12-06 DIAGNOSIS — M6289 Other specified disorders of muscle: Secondary | ICD-10-CM | POA: Diagnosis not present

## 2016-12-06 DIAGNOSIS — Z1211 Encounter for screening for malignant neoplasm of colon: Secondary | ICD-10-CM

## 2016-12-06 NOTE — Progress Notes (Signed)
   Subjective:    Patient ID: Ashley [name], female    DOB: 1965/11/03, 51 y.o.   MRN: [id number]  Patient presents for Needs colonscopy scheduled   She will like referral to get her colonoscopy done no other concerns medications reviewed she states she again weight and has lost it now with "[facility]"- Weight loss program, has lost 15lbs Constipation- uses linzess as needed GERD-using the prilosec as needed  Flu shot done already  She does continue to use the Valium for her pelvic floor dysfunction which was initially prescribed by urology she alternates this with the lorazepam for her anxiety  Review Of Systems:  GEN- denies fatigue, fever, weight loss,weakness, recent illness HEENT- denies eye drainage, change in vision, nasal discharge, CVS- denies chest pain, palpitations RESP- denies SOB, cough, wheeze ABD- denies N/V, change in stools, abd pain GU- denies dysuria, hematuria, dribbling, incontinence MSK- denies joint pain, muscle aches, injury Neuro- denies headache, dizziness, syncope, seizure activity       Objective:    BP 128/80   Pulse 78   Temp 97.9 F (36.6 C) (Oral)   Resp 14   Ht  (1.575 m)   Wt 160 lb (72.6 kg)   SpO2 97%   BMI 29.26 kg/m  GEN- NAD, alert and oriented x3 CVS- RRR, no murmur RESP-CTAB ABD-NABS,soft,NT,ND Psych- normal affect and mood EXT- No edema Pulses- Radial,2+        Assessment & Plan:      Problem List Items Addressed This Visit      Unprioritized   Pelvic floor dysfunction - Primary    Continue Valium as needed      Anxiety state    Continue lorazepam as needed       Other Visit Diagnoses    Colon cancer screening       Refer for colonoscopy prior constipation has improved with dietary changes   Relevant Orders   Ambulatory referral to Gastroenterology      Note: This dictation was prepared with Dragon dictation along with smaller phrase technology. Any transcriptional errors that result from this process  are unintentional.

## 2016-12-06 NOTE — Patient Instructions (Addendum)
Referral to GI  F/U December for PHYSICAL

## 2016-12-07 NOTE — Assessment & Plan Note (Signed)
Continue Valium as needed 

## 2016-12-07 NOTE — Assessment & Plan Note (Signed)
Continue lorazepam as needed ?

## 2017-02-09 ENCOUNTER — Other Ambulatory Visit: Payer: Self-pay | Admitting: Family Medicine

## 2017-02-09 NOTE — Telephone Encounter (Signed)
Okay to refill to mail order

## 2017-02-09 NOTE — Telephone Encounter (Signed)
Ok to refill both to mail order??  Last office visit 12/06/2016.  Last refill on both 11/17/2016.

## 2017-02-10 ENCOUNTER — Other Ambulatory Visit: Payer: Self-pay | Admitting: Family Medicine

## 2017-02-10 MED ORDER — LORAZEPAM 0.5 MG PO TABS: 0.5000 mg | ORAL_TABLET | Freq: Two times a day (BID) | ORAL | 0 refills | Status: DC | PRN

## 2017-02-10 MED ORDER — DIAZEPAM 2 MG PO TABS: 2.0000 mg | ORAL_TABLET | Freq: Every evening | ORAL | 0 refills | Status: DC | PRN

## 2017-02-10 NOTE — Telephone Encounter (Signed)
Prescription faxed

## 2017-02-13 ENCOUNTER — Encounter: Payer: Self-pay | Admitting: Family Medicine

## 2017-02-15 MED ORDER — LORAZEPAM 0.5 MG PO TABS: 0.5000 mg | ORAL_TABLET | Freq: Two times a day (BID) | ORAL | 0 refills | Status: DC | PRN

## 2017-02-21 MED ORDER — LORAZEPAM 0.5 MG PO TABS: 0.5000 mg | ORAL_TABLET | Freq: Two times a day (BID) | ORAL | 0 refills | Status: DC | PRN

## 2017-02-21 NOTE — Addendum Note (Signed)
Addended by: Phillips OdorSIX, CHRISTINA H on: 02/21/2017 02:48 PM   Modules accepted: Orders

## 2017-03-15 ENCOUNTER — Other Ambulatory Visit (INDEPENDENT_AMBULATORY_CARE_PROVIDER_SITE_OTHER): Payer: Self-pay | Admitting: *Deleted

## 2017-03-15 DIAGNOSIS — Z800 Family history of malignant neoplasm of digestive organs: Secondary | ICD-10-CM

## 2017-03-15 DIAGNOSIS — Z1211 Encounter for screening for malignant neoplasm of colon: Secondary | ICD-10-CM

## 2017-05-12 ENCOUNTER — Other Ambulatory Visit: Payer: Self-pay

## 2017-05-12 ENCOUNTER — Encounter: Payer: Self-pay | Admitting: Family Medicine

## 2017-05-12 ENCOUNTER — Encounter (HOSPITAL_COMMUNITY): Payer: Self-pay

## 2017-05-12 ENCOUNTER — Ambulatory Visit: Payer: BLUE CROSS/BLUE SHIELD | Admitting: Family Medicine

## 2017-05-12 ENCOUNTER — Ambulatory Visit (HOSPITAL_COMMUNITY)
Admission: RE | Admit: 2017-05-12 | Discharge: 2017-05-12 | Disposition: A | Discharge status: Home / Self Care | Payer: BLUE CROSS/BLUE SHIELD | Source: Ambulatory Visit | Attending: Family Medicine | Admitting: Family Medicine

## 2017-05-12 VITALS — BP 122/64 | HR 86 | Temp 97.9°F | Resp 12 | Ht 62.0 in | Wt 157.0 lb

## 2017-05-12 DIAGNOSIS — Z1239 Encounter for other screening for malignant neoplasm of breast: Secondary | ICD-10-CM

## 2017-05-12 DIAGNOSIS — F411 Generalized anxiety disorder: Secondary | ICD-10-CM | POA: Diagnosis not present

## 2017-05-12 DIAGNOSIS — N952 Postmenopausal atrophic vaginitis: Secondary | ICD-10-CM

## 2017-05-12 DIAGNOSIS — R12 Heartburn: Secondary | ICD-10-CM

## 2017-05-12 DIAGNOSIS — M6289 Other specified disorders of muscle: Secondary | ICD-10-CM

## 2017-05-12 DIAGNOSIS — Z1231 Encounter for screening mammogram for malignant neoplasm of breast: Secondary | ICD-10-CM | POA: Insufficient documentation

## 2017-05-12 MED ORDER — LORAZEPAM 0.5 MG PO TABS: 0.5000 mg | ORAL_TABLET | Freq: Two times a day (BID) | ORAL | 0 refills | Status: DC | PRN

## 2017-05-12 MED ORDER — ESTRADIOL 0.1 MG/GM VA CREA: 1.0000 | TOPICAL_CREAM | VAGINAL | 12 refills | Status: DC

## 2017-05-12 MED ORDER — DIAZEPAM 2 MG PO TABS: 2.0000 mg | ORAL_TABLET | Freq: Every evening | ORAL | 0 refills | Status: DC | PRN

## 2017-05-12 NOTE — Assessment & Plan Note (Signed)
Doing well on ativan

## 2017-05-12 NOTE — Assessment & Plan Note (Signed)
Cancer in her family is quite far removed.  Discussed that using topical estrogen would have lower risk than the systemic.  However she does need to have a mammogram first.  This is been ordered today she will call and get this set up if her mammogram is normal then she will start with the estrogen cream 3 times a week we will try to use the smallest amount possible.

## 2017-05-12 NOTE — Assessment & Plan Note (Signed)
Continue valium  

## 2017-05-12 NOTE — Patient Instructions (Addendum)
Get mammogram done if normal   Estrogen vaginal cream  F/U as previous

## 2017-05-12 NOTE — Progress Notes (Signed)
   Subjective:    Patient ID: Ashley [name], female    DOB: 07/31/1965, 52 y.o.   MRN: [id number]  Patient presents for Follow-up (is not fasting)   Pt here to f/u chronic medical problems Medications reviewed   Family history Breast cancer in Maternal Aunt and Great Aunts, none in sister or mother   Vaginal  Due for Mammogram- last in Dec 2017 Been having increased vaginal dryness over the past few months.  She is used over-the-counter vaginal moisturizers which have not helped.  Intercourse is uncomfortable.  She would like to try something prescription to help with her symptoms.  She does not have any significant hot flashes occasionally feels moody but nothing out of the ordinary.    Has pelvic floor dysfunction see by urlogy, maintained on  Valium 2mg    Anxiety and insomnia uses Lorazepam as needed.  She does not take at the same time of the Valium   Colonoscopy- May 8th    GERD- as needed prilosec   Review Of Systems:  GEN- denies fatigue, fever, weight loss,weakness, recent illness HEENT- denies eye drainage, change in vision, nasal discharge, CVS- denies chest pain, palpitations RESP- denies SOB, cough, wheeze ABD- denies N/V, change in stools, abd pain GU- denies dysuria, hematuria, dribbling, incontinence MSK- denies joint pain, muscle aches, injury Neuro- denies headache, dizziness, syncope, seizure activity       Objective:    BP 122/64   Pulse 86   Temp 97.9 F (36.6 C) (Oral)   Resp 12   Ht 5\' 2"  (1.575 m)   Wt 157 lb (71.2 kg)   SpO2 98%   BMI 28.72 kg/m  GEN- NAD, alert and oriented x3 HEENT- PERRL, EOMI, non injected sclera, pink conjunctiva, MMM, oropharynx clear Neck- Supple, no thyromegaly CVS- RRR, no murmur RESP-CTAB Psych- normal affect and mood  ABD-NABS,soft,NT,ND EXT- No edema Pulses- Radial 2+        Assessment & Plan:      Problem List Items Addressed This Visit      Unprioritized   Vaginal atrophy    Cancer in her family is  quite far removed.  Discussed that using topical estrogen would have lower risk than the systemic.  However she does need to have a mammogram first.  This is been ordered today she will call and get this set up if her mammogram is normal then she will start with the estrogen cream 3 times a week we will try to use the smallest amount possible.      Pelvic floor dysfunction - Primary    Continue valium      Heartburn    Continue prn omeprazole      Anxiety state    Doing well on ativan       Other Visit Diagnoses    Breast cancer screening       Relevant Orders   MM SCREENING BREAST TOMO BILATERAL      Note: This dictation was prepared with Dragon dictation along with smaller phrase technology. Any transcriptional errors that result from this process are unintentional.

## 2017-05-12 NOTE — Assessment & Plan Note (Signed)
Continue prn omeprazole

## 2017-05-12 NOTE — Addendum Note (Signed)
Addended by: Milinda AntisURHAM, KAWANTA F on: 05/12/2017 05:12 PM   Modules accepted: Orders

## 2017-05-12 NOTE — Addendum Note (Signed)
Addended by: Milinda AntisURHAM, KAWANTA F on: 05/12/2017 08:49 AM   Modules accepted: Orders

## 2017-06-05 ENCOUNTER — Telehealth (INDEPENDENT_AMBULATORY_CARE_PROVIDER_SITE_OTHER): Payer: Self-pay | Admitting: *Deleted

## 2017-06-05 ENCOUNTER — Encounter (INDEPENDENT_AMBULATORY_CARE_PROVIDER_SITE_OTHER): Payer: Self-pay | Admitting: *Deleted

## 2017-06-05 MED ORDER — PEG 3350-KCL-NA BICARB-NACL 420 G PO SOLR: 4000.0000 mL | Freq: Once | ORAL | 0 refills | Status: AC

## 2017-06-05 NOTE — Telephone Encounter (Signed)
Patient needs trilyte 

## 2017-06-16 ENCOUNTER — Telehealth (INDEPENDENT_AMBULATORY_CARE_PROVIDER_SITE_OTHER): Payer: Self-pay | Admitting: *Deleted

## 2017-06-16 NOTE — Telephone Encounter (Signed)
Referring MD/PCP: Friant   Procedure: tcs  Reason/Indication:  Screening, fam hx colon ca  Has patient had this procedure before?  no  If so, when, by whom and where?    Is there a family history of colon cancer?  Yes, maternal uncles  Who?  What age when diagnosed?    Is patient diabetic?   no      Does patient have prosthetic heart valve or mechanical valve?  no  Do you have a pacemaker?  no  Has patient ever had endocarditis? no  Has patient had joint replacement within last 12 months?  no  Is patient constipated or do they take laxatives? no  Does patient have a history of alcohol/drug use?  no  Is patient on blood thinner such as Coumadin, Plavix and/or Aspirin? no  Medications: see epic  Allergies: see epic  Medication Adjustment per Dr Keane Policeehman/Terri Setzer, NP:   Procedure date & time: 07/12/17 at 830

## 2017-06-19 NOTE — Telephone Encounter (Signed)
agree

## 2017-07-12 ENCOUNTER — Other Ambulatory Visit: Payer: Self-pay

## 2017-07-12 ENCOUNTER — Ambulatory Visit (HOSPITAL_COMMUNITY)
Admission: RE | Admit: 2017-07-12 | Discharge: 2017-07-12 | Disposition: A | Discharge status: Home / Self Care | Payer: BLUE CROSS/BLUE SHIELD | Source: Ambulatory Visit | Attending: Internal Medicine | Admitting: Internal Medicine

## 2017-07-12 ENCOUNTER — Encounter (HOSPITAL_COMMUNITY): Payer: Self-pay

## 2017-07-12 ENCOUNTER — Encounter (HOSPITAL_COMMUNITY)
Admission: RE | Disposition: A | Discharge status: Home / Self Care | Payer: Self-pay | Source: Ambulatory Visit | Attending: Internal Medicine

## 2017-07-12 DIAGNOSIS — Z888 Allergy status to other drugs, medicaments and biological substances status: Secondary | ICD-10-CM | POA: Diagnosis not present

## 2017-07-12 DIAGNOSIS — E785 Hyperlipidemia, unspecified: Secondary | ICD-10-CM | POA: Diagnosis not present

## 2017-07-12 DIAGNOSIS — K5730 Diverticulosis of large intestine without perforation or abscess without bleeding: Secondary | ICD-10-CM | POA: Diagnosis not present

## 2017-07-12 DIAGNOSIS — Z87891 Personal history of nicotine dependence: Secondary | ICD-10-CM | POA: Diagnosis not present

## 2017-07-12 DIAGNOSIS — Z800 Family history of malignant neoplasm of digestive organs: Secondary | ICD-10-CM | POA: Insufficient documentation

## 2017-07-12 DIAGNOSIS — Z79899 Other long term (current) drug therapy: Secondary | ICD-10-CM | POA: Diagnosis not present

## 2017-07-12 DIAGNOSIS — F419 Anxiety disorder, unspecified: Secondary | ICD-10-CM | POA: Insufficient documentation

## 2017-07-12 DIAGNOSIS — Z91030 Bee allergy status: Secondary | ICD-10-CM | POA: Diagnosis not present

## 2017-07-12 DIAGNOSIS — Z1211 Encounter for screening for malignant neoplasm of colon: Secondary | ICD-10-CM | POA: Insufficient documentation

## 2017-07-12 HISTORY — PX: COLONOSCOPY: SHX5424

## 2017-07-12 SURGERY — COLONOSCOPY
Anesthesia: Moderate Sedation

## 2017-07-12 MED ORDER — STERILE WATER FOR IRRIGATION IR SOLN
Status: DC | PRN
  Administered 2017-07-12: 100 mL

## 2017-07-12 MED ORDER — SODIUM CHLORIDE 0.9 % IV SOLN
INTRAVENOUS | Status: DC
  Administered 2017-07-12: 08:00:00 via INTRAVENOUS

## 2017-07-12 MED ORDER — MEPERIDINE HCL 50 MG/ML IJ SOLN
INTRAMUSCULAR | Status: AC
  Filled 2017-07-12: qty 1

## 2017-07-12 MED ORDER — MIDAZOLAM HCL 5 MG/5ML IJ SOLN
INTRAMUSCULAR | Status: DC | PRN
  Administered 2017-07-12 (×2): 1 mg via INTRAVENOUS
  Administered 2017-07-12 (×2): 2 mg via INTRAVENOUS
  Administered 2017-07-12: 1 mg via INTRAVENOUS
  Administered 2017-07-12: 3 mg via INTRAVENOUS
  Administered 2017-07-12: 2 mg via INTRAVENOUS

## 2017-07-12 MED ORDER — MEPERIDINE HCL 50 MG/ML IJ SOLN
INTRAMUSCULAR | Status: DC | PRN
  Administered 2017-07-12 (×3): 25 mg via INTRAVENOUS

## 2017-07-12 MED ORDER — MIDAZOLAM HCL 5 MG/5ML IJ SOLN
INTRAMUSCULAR | Status: AC
  Filled 2017-07-12: qty 5

## 2017-07-12 MED ORDER — MIDAZOLAM HCL 5 MG/5ML IJ SOLN
INTRAMUSCULAR | Status: AC
  Filled 2017-07-12: qty 10

## 2017-07-12 NOTE — Discharge Instructions (Signed)
Resume usual medications as before. High fiber diet. No driving for 24 hours. Next screening colonoscopy in 5 years.        Colonoscopy, Adult, Care After This sheet gives you information about how to care for yourself after your procedure. Your doctor may also give you more specific instructions. If you have problems or questions, call your doctor. Follow these instructions at home: General instructions   For the first 24 hours after the procedure: ? Do not drive or use machinery. ? Do not sign important documents. ? Do not drink alcohol. ? Do your daily activities more slowly than normal. ? Eat foods that are soft and easy to digest. ? Rest often.  Take over-the-counter or prescription medicines only as told by your doctor.  It is up to you to get the results of your procedure. Ask your doctor, or the department performing the procedure, when your results will be ready. To help cramping and bloating:  Try walking around.  Put heat on your belly (abdomen) as told by your doctor. Use a heat source that your doctor recommends, such as a moist heat pack or a heating pad. ? Put a towel between your skin and the heat source. ? Leave the heat on for 20-30 minutes. ? Remove the heat if your skin turns bright red. This is especially important if you cannot feel pain, heat, or cold. You can get burned. Eating and drinking  Drink enough fluid to keep your pee (urine) clear or pale yellow.  Return to your normal diet as told by your doctor. Avoid heavy or fried foods that are hard to digest.  Avoid drinking alcohol for as long as told by your doctor. Contact a doctor if:  You have blood in your poop (stool) 2-3 days after the procedure. Get help right away if:  You have more than a small amount of blood in your poop.  You see large clumps of tissue (blood clots) in your poop.  Your belly is swollen.  You feel sick to your stomach (nauseous).  You throw up (vomit).  You  have a fever.  You have belly pain that gets worse, and medicine does not help your pain. This information is not intended to replace advice given to you by your health care provider. Make sure you discuss any questions you have with your health care provider. Document Released: 03/26/2010 Document Revised: 11/16/2015 Document Reviewed: 11/16/2015 Elsevier Interactive Patient Education  2017 Else[employer] Inc.     Diverticulosis Diverticulosis is a condition that develops when small pouches (diverticula) form in the wall of the large intestine (colon). The colon is where water is absorbed and stool is formed. The pouches form when the inside layer of the colon pushes through weak spots in the outer layers of the colon. You may have a few pouches or many of them. What are the causes? The cause of this condition is not known. What increases the risk? The following factors may make you more likely to develop this condition:  Being older than age 80. Your risk for this condition increases with age. Diverticulosis is rare among people younger than age 9. By age 24, many people have it.  Eating a low-fiber diet.  Having frequent constipation.  Being overweight.  Not getting enough exercise.  Smoking.  Taking over-the-counter pain medicines, like aspirin and ibuprofen.  Having a family history of diverticulosis.  What are the signs or symptoms? In most people, there are no symptoms of this condition.  If you do have symptoms, they may include:  Bloating.  Cramps in the abdomen.  Constipation or diarrhea.  Pain in the lower left side of the abdomen.  How is this diagnosed? This condition is most often diagnosed during an exam for other colon problems. Because diverticulosis usually has no symptoms, it often cannot be diagnosed independently. This condition may be diagnosed by:  Using a flexible scope to examine the colon (colonoscopy).  Taking an X-ray of the colon after dye has  been put into the colon (barium enema).  Doing a CT scan.  How is this treated? You may not need treatment for this condition if you have never developed an infection related to diverticulosis. If you have had an infection before, treatment may include:  Eating a high-fiber diet. This may include eating more fruits, vegetables, and grains.  Taking a fiber supplement.  Taking a live bacteria supplement (probiotic).  Taking medicine to relax your colon.  Taking antibiotic medicines.  Follow these instructions at home:  Drink 6-8 glasses of water or more each day to prevent constipation.  Try not to strain when you have a bowel movement.  If you have had an infection before: ? Eat more fiber as directed by your health care provider or your diet and nutrition specialist (dietitian). ? Take a fiber supplement or probiotic, if your health care provider approves.  Take over-the-counter and prescription medicines only as told by your health care provider.  If you were prescribed an antibiotic, take it as told by your health care provider. Do not stop taking the antibiotic even if you start to feel better.  Keep all follow-up visits as told by your health care provider. This is important. Contact a health care provider if:  You have pain in your abdomen.  You have bloating.  You have cramps.  You have not had a bowel movement in 3 days. Get help right away if:  Your pain gets worse.  Your bloating becomes very bad.  You have a fever or chills, and your symptoms suddenly get worse.  You vomit.  You have bowel movements that are bloody or black.  You have bleeding from your rectum. Summary  Diverticulosis is a condition that develops when small pouches (diverticula) form in the wall of the large intestine (colon).  You may have a few pouches or many of them.  This condition is most often diagnosed during an exam for other colon problems.  If you have had an  infection related to diverticulosis, treatment may include increasing the fiber in your diet, taking supplements, or taking medicines. This information is not intended to replace advice given to you by your health care provider. Make sure you discuss any questions you have with your health care provider. Document Released: 11/19/2003 Document Revised: 01/11/2016 Document Reviewed: 01/11/2016 Elsevier Interactive Patient Education  2017 Elsevier Inc.     High-Fiber Diet Fiber, also called dietary fiber, is a type of carbohydrate found in fruits, vegetables, whole grains, and beans. A high-fiber diet can have many health benefits. Your health care provider may recommend a high-fiber diet to help:  Prevent constipation. Fiber can make your bowel movements more regular.  Lower your cholesterol.  Relieve hemorrhoids, uncomplicated diverticulosis, or irritable bowel syndrome.  Prevent overeating as part of a weight-loss plan.  Prevent heart disease, type 2 diabetes, and certain cancers.  What is my plan? The recommended daily intake of fiber includes:  38 grams for men under age 79.  30 grams for men over age 23.  25 grams for women under age 43.  21 grams for women over age 52.  You can get the recommended daily intake of dietary fiber by eating a variety of fruits, vegetables, grains, and beans. Your health care provider may also recommend a fiber supplement if it is not possible to get enough fiber through your diet. What do I need to know about a high-fiber diet?  Fiber supplements have not been widely studied for their effectiveness, so it is better to get fiber through food sources.  Always check the fiber content on thenutrition facts label of any prepackaged food. Look for foods that contain at least 5 grams of fiber per serving.  Ask your dietitian if you have questions about specific foods that are related to your condition, especially if those foods are not listed in the  following section.  Increase your daily fiber consumption gradually. Increasing your intake of dietary fiber too quickly may cause bloating, cramping, or gas.  Drink plenty of water. Water helps you to digest fiber. What foods can I eat? Grains Whole-grain breads. Multigrain cereal. Oats and oatmeal. Brown rice. Barley. Bulgur wheat. Millet. Bran muffins. Popcorn. Rye wafer crackers. Vegetables Sweet potatoes. Spinach. Kale. Artichokes. Cabbage. Broccoli. Green peas. Carrots. Squash. Fruits Berries. Pears. Apples. Oranges. Avocados. Prunes and raisins. Dried figs. Meats and Other Protein Sources Navy, kidney, pinto, and soy beans. Split peas. Lentils. Nuts and seeds. Dairy Fiber-fortified yogurt. Beverages Fiber-fortified soy milk. Fiber-fortified orange juice. Other Fiber bars. The items listed above may not be a complete list of recommended foods or beverages. Contact your dietitian for more options. What foods are not recommended? Grains White bread. Pasta made with refined flour. White rice. Vegetables Fried potatoes. Canned vegetables. Well-cooked vegetables. Fruits Fruit juice. Cooked, strained fruit. Meats and Other Protein Sources Fatty cuts of meat. Fried Environmental education officer or fried fish. Dairy Milk. Yogurt. Cream cheese. Sour cream. Beverages Soft drinks. Other Cakes and pastries. Butter and oils. The items listed above may not be a complete list of foods and beverages to avoid. Contact your dietitian for more information. What are some tips for including high-fiber foods in my diet?  Eat a wide variety of high-fiber foods.  Make sure that half of all grains consumed each day are whole grains.  Replace breads and cereals made from refined flour or white flour with whole-grain breads and cereals.  Replace white rice with brown rice, bulgur wheat, or millet.  Start the day with a breakfast that is high in fiber, such as a cereal that contains at least 5 grams of fiber per  serving.  Use beans in place of meat in soups, salads, or pasta.  Eat high-fiber snacks, such as berries, raw vegetables, nuts, or popcorn. This information is not intended to replace advice given to you by your health care provider. Make sure you discuss any questions you have with your health care provider. Document Released: 02/21/2005 Document Revised: 07/30/2015 Document Reviewed: 08/06/2013 Elsevier Interactive Patient Education  [employer].

## 2017-07-12 NOTE — H&P (Signed)
Ashley [name] is an 52 y.o. female.   Chief Complaint: Patient is here for colonoscopy. HPI: Patient is a 52 year old Caucasian female who is here for screening colonoscopy.  She denies abdominal pain change in bowel habits or rectal bleeding. Family history is positive for CRC in 2 maternal uncles.  One was in his late 42s and died around 26 and the other uncle was 32 and died within few months. She has 3 siblings and they do not have any polyps.  Her mother also does not have any polyps.  Past Medical History:  Diagnosis Date  . Anxiety   . Hyperlipidemia     Past Surgical History:  Procedure Laterality Date  . ABDOMINAL HYSTERECTOMY     Endometriosis  . CARPAL TUNNEL RELEASE  2005   bilateral  . FOOT ARTHRODESIS Right 07/12/2012   Procedure: ARTHRODESIS FOOT FIRST  METATARSOPHALANGEAL JOINT;  Surgeon: Dallas Schimke, DPM;  Location: [facility];  Service: Orthopedics;  Laterality: Right;    Family History  Problem Relation Age of Onset  . Diabetes Father   . Cancer Maternal Aunt   . Cancer Maternal Uncle        colon cancer   Social History:  reports that she has quit smoking. She has never used smokeless tobacco. She reports that she drinks alcohol. She reports that she does not use drugs.  Allergies:  Allergies  Allergen Reactions  . Bee Venom Hives  . Statins     Muscle aches    Medications Prior to Admission  Medication Sig Dispense Refill  . diazepam (VALIUM) 2 MG tablet Take 1 tablet (2 mg total) by mouth at bedtime as needed for anxiety. 90 tablet 0  . estradiol (ESTRACE) 0.1 MG/GM vaginal cream Place 1 Applicatorful vaginally 3 (three) times a week. 42.5 g 12  . LORazepam (ATIVAN) 0.5 MG tablet Take 1 tablet (0.5 mg total) by mouth 2 (two) times daily as needed. for anxiety 90 tablet 0  . Multiple Vitamin (MULTIVITAMIN) tablet Take 1 tablet by mouth daily.      . Omega-3 Fatty Acids (FISH OIL) 1200 MG CAPS Take 1,200 mg by mouth daily.    Marland Kitchen omeprazole  (PRILOSEC OTC) 20 MG tablet Take 20 mg by mouth daily as needed.       No results found for this or any previous visit (from the past 48 hour(s)). No results found.  ROS  Blood pressure 117/62, pulse 63, temperature 97.7 F (36.5 C), temperature source Oral, resp. rate 14, height  (1.575 m), weight 149 lb (67.6 kg), SpO2 99 %. Physical Exam  Constitutional: She appears well-developed and well-nourished.  HENT:  Mouth/Throat: Oropharynx is clear and moist.  Eyes: Conjunctivae are normal. No scleral icterus.  Neck: No thyromegaly present.  Cardiovascular: Normal rate, regular rhythm and normal heart sounds.  No murmur heard. Respiratory: Effort normal and breath sounds normal.  GI:  Abdomen is symmetrical with lower midline scar.  It is soft and nontender without organomegaly or masses.  Musculoskeletal: She exhibits no edema.  Lymphadenopathy:    She has no cervical adenopathy.  Neurological: She is alert.  Skin: Skin is warm and dry.     Assessment/Plan Screening colonoscopy. Family history positive for CRC in 2 maternal uncles.  Lionel December, MD 07/12/2017, 8:16 AM

## 2017-07-12 NOTE — Op Note (Signed)
[facility] Patient Name: Ashley [name] Procedure Date: 07/12/2017 8:00 AM MRN: [id number] Date of Birth: 03-18-1965 Attending MD: Lionel December , MD CSN: [id number] Age: 52 Admit Type: Outpatient Procedure:                Colonoscopy Indications:              Screening for colorectal malignant neoplasm, Colon                            cancer screening in patient at increased risk:                            Family history of colorectal cancer in multiple 2nd                            degree relatives Providers:                Lionel December, MD, Edrick Kins, RN, Dyann Ruddle Referring MD:             Velna Hatchet. [place], MD Medicines:                Meperidine 75 mg IV, Midazolam 12 mg IV Complications:            No immediate complications. Estimated Blood Loss:     Estimated blood loss: none. Procedure:                Pre-Anesthesia Assessment:                           - Prior to the procedure, a History and Physical                            was performed, and patient medications and                            allergies were reviewed. The patient's tolerance of                            previous anesthesia was also reviewed. The risks                            and benefits of the procedure and the sedation                            options and risks were discussed with the patient.                            All questions were answered, and informed consent                            was obtained. Prior Anticoagulants: The patient has                            taken no previous anticoagulant or antiplatelet  agents. ASA Grade Assessment: II - A patient with                            mild systemic disease. After reviewing the risks                            and benefits, the patient was deemed in                            satisfactory condition to undergo the procedure.                           After obtaining informed consent, the colonoscope                            was passed under direct vision. Throughout the                            procedure, the patient's blood pressure, pulse, and                            oxygen saturations were monitored continuously. The                            [id number] ([id number]) scope was introduced through                            the anus and advanced to the the cecum, identified                            by appendiceal orifice and ileocecal valve. The                            colonoscopy was technically difficult and complex                            due to restricted mobility of the colon. The                            patient tolerated the procedure well. The quality                            of the bowel preparation was excellent. The                            ileocecal valve, appendiceal orifice, and rectum                            were photographed. Scope In: 8:36:28 AM Scope Out: 8:58:08 AM Scope Withdrawal Time: 0 hours 7 minutes 30 seconds  Total Procedure Duration: 0 hours 21 minutes 40 seconds  Findings:      The perianal and digital rectal examinations were normal.      A few small-mouthed diverticula were found in the sigmoid  colon.      The exam was otherwise normal throughout the examined colon.      A Linear scar due to prior episiotomy was found in the distal rectum. Impression:               - Diverticulosis in the sigmoid colon.                           - No specimens collected. Moderate Sedation:      Moderate (conscious) sedation was administered by the endoscopy nurse       and supervised by the endoscopist. The following parameters were       monitored: oxygen saturation, heart rate, blood pressure, CO2       capnography and response to care. Total physician intraservice time was       38 minutes. Recommendation:           - Patient has a contact number available for                            emergencies. The signs and symptoms of potential                             delayed complications were discussed with the                            patient. Return to normal activities tomorrow.                            Written discharge instructions were provided to the                            patient.                           - High fiber diet today.                           - Continue present medications.                           - Repeat colonoscopy in 5 years for screening                            purposes with propofol. Procedure Code(s):        --- Professional ---                           [id number], Colonoscopy, flexible; diagnostic, including                            collection of specimen(s) by brushing or washing,                            when performed (separate procedure)                           G0500, Moderate sedation services provided by  the                            same physician or other qualified health care                            professional performing a gastrointestinal                            endoscopic service that sedation supports,                            requiring the presence of an independent trained                            observer to assist in the monitoring of the                            patient's level of consciousness and physiological                            status; initial 15 minutes of intra-service time;                            patient age 21 years or older (additional time may                            be reported with 16109, as appropriate)                           [phone], Moderate sedation services provided by the                            same physician or other qualified health care                            professional performing the diagnostic or                            therapeutic service that the sedation supports,                            requiring the presence of an independent trained                            observer to assist in the monitoring of  the                            patient's level of consciousness and physiological                            status; each additional 15 minutes intraservice                            time (List  separately in addition to code for                            primary service)                           [phone], Moderate sedation services provided by the                            same physician or other qualified health care                            professional performing the diagnostic or                            therapeutic service that the sedation supports,                            requiring the presence of an independent trained                            observer to assist in the monitoring of the                            patient's level of consciousness and physiological                            status; each additional 15 minutes intraservice                            time (List separately in addition to code for                            primary service) Diagnosis Code(s):        --- Professional ---                           Z12.11, Encounter for screening for malignant                            neoplasm of colon                           Z80.0, Family history of malignant neoplasm of                            digestive organs                           K57.30, Diverticulosis of large intestine without                            perforation or abscess without bleeding CPT copyright 2017 American Medical Association. All rights reserved. The codes documented in this report are preliminary and upon coder review may  be revised to meet current compliance requirements. Lionel December, MD [place]  Karilyn Cota, MD 07/12/2017 9:08:01 AM This report has been signed electronically. Number of Addenda: 0

## 2017-07-18 ENCOUNTER — Encounter (HOSPITAL_COMMUNITY): Payer: Self-pay | Admitting: Internal Medicine

## 2017-08-09 ENCOUNTER — Ambulatory Visit (INDEPENDENT_AMBULATORY_CARE_PROVIDER_SITE_OTHER): Payer: BLUE CROSS/BLUE SHIELD | Admitting: Family Medicine

## 2017-08-09 ENCOUNTER — Other Ambulatory Visit: Payer: Self-pay

## 2017-08-09 ENCOUNTER — Encounter: Payer: Self-pay | Admitting: Family Medicine

## 2017-08-09 VITALS — BP 110/64 | HR 64 | Temp 97.7°F | Resp 12 | Ht 62.0 in | Wt 152.0 lb

## 2017-08-09 DIAGNOSIS — Z0000 Encounter for general adult medical examination without abnormal findings: Secondary | ICD-10-CM

## 2017-08-09 DIAGNOSIS — F411 Generalized anxiety disorder: Secondary | ICD-10-CM

## 2017-08-09 DIAGNOSIS — E663 Overweight: Secondary | ICD-10-CM

## 2017-08-09 NOTE — Patient Instructions (Signed)
Shingrix sent to pharmacy  Medications refilled  We will call with lab results F/U 1 year for Physical

## 2017-08-09 NOTE — Progress Notes (Signed)
   Subjective:    Patient ID: Ashley [name], female    DOB: 02/14/1966, 52 y.o.   MRN: [id number]  Patient presents for CPE (is fasting)   Pt here for CPE  Medications reviewed Colonoscopy UTD Mammogram UTD  Immunizations - TDAP UTD, discussed shingrix   Tapered off Valium, now using ativan twice a day instead.She feels good, also using estrogen cream which helps    Review Of Systems:  GEN- denies fatigue, fever, weight loss,weakness, recent illness HEENT- denies eye drainage, change in vision, nasal discharge, CVS- denies chest pain, palpitations RESP- denies SOB, cough, wheeze ABD- denies N/V, change in stools, abd pain GU- denies dysuria, hematuria, dribbling, incontinence MSK- denies joint pain, muscle aches, injury Neuro- denies headache, dizziness, syncope, seizure activity       Objective:    BP 110/64   Pulse 64   Temp 97.7 F (36.5 C) (Oral)   Resp 12   Ht 5\' 2"  (1.575 m)   Wt 152 lb (68.9 kg)   SpO2 99%   BMI 27.80 kg/m  GEN- NAD, alert and oriented x3 HEENT- PERRL, EOMI, non injected sclera, pink conjunctiva, MMM, oropharynx clear Neck- Supple, no thyromegaly CVS- RRR, no murmur RESP-CTAB ABD-NABS,soft,NT,ND EXT- No edema Pulses- Radial, DP- 2+        Assessment & Plan:      Problem List Items Addressed This Visit      Unprioritized   Anxiety state    Continue ativan, will also use for her pelvic floor dysfunction, atrophy along with estrogen vaginal      Overweight - Primary    Other Visit Diagnoses    Routine general medical examination at a health care facility       CPE done, continues to exercise, lose weight, fasting labs, continue ativan BID, shingrix to pharmacy, prevention UTD   Relevant Orders   CBC with Differential/Platelet (Completed)   Comprehensive metabolic panel (Completed)   Lipid panel (Completed)      Note: This dictation was prepared with Dragon dictation along with smaller phrase technology. Any transcriptional  errors that result from this process are unintentional.

## 2017-08-10 ENCOUNTER — Encounter: Payer: Self-pay | Admitting: Family Medicine

## 2017-08-10 LAB — COMPREHENSIVE METABOLIC PANEL
AG RATIO: 1.7 (calc) (ref 1.0–2.5)
ALBUMIN MSPROF: 4.7 g/dL (ref 3.6–5.1)
ALKALINE PHOSPHATASE (APISO): 97 U/L (ref 33–130)
ALT: 18 U/L (ref 6–29)
AST: 20 U/L (ref 10–35)
BILIRUBIN TOTAL: 0.4 mg/dL (ref 0.2–1.2)
BUN: 15 mg/dL (ref 7–25)
CALCIUM: 9.7 mg/dL (ref 8.6–10.4)
CHLORIDE: 102 mmol/L (ref 98–110)
CO2: 27 mmol/L (ref 20–32)
CREATININE: 0.75 mg/dL (ref 0.50–1.05)
GLOBULIN: 2.7 g/dL (ref 1.9–3.7)
Glucose, Bld: 96 mg/dL (ref 65–99)
POTASSIUM: 4 mmol/L (ref 3.5–5.3)
Sodium: 139 mmol/L (ref 135–146)
Total Protein: 7.4 g/dL (ref 6.1–8.1)

## 2017-08-10 LAB — CBC WITH DIFFERENTIAL/PLATELET
Basophils Absolute: 43 cells/uL (ref 0–200)
Basophils Relative: 0.7 %
EOS PCT: 1.8 %
Eosinophils Absolute: 110 cells/uL (ref 15–500)
HCT: 39.6 % (ref 35.0–45.0)
Hemoglobin: 13.7 g/dL (ref 11.7–15.5)
Lymphs Abs: 2782 cells/uL (ref 850–3900)
MCH: 32.2 pg (ref 27.0–33.0)
MCHC: 34.6 g/dL (ref 32.0–36.0)
MCV: 93.2 fL (ref 80.0–100.0)
MONOS PCT: 9.7 %
MPV: 10.3 fL (ref 7.5–12.5)
NEUTROS PCT: 42.2 %
Neutro Abs: 2574 cells/uL (ref 1500–7800)
PLATELETS: 260 10*3/uL (ref 140–400)
RBC: 4.25 10*6/uL (ref 3.80–5.10)
RDW: 11.9 % (ref 11.0–15.0)
TOTAL LYMPHOCYTE: 45.6 %
WBC: 6.1 10*3/uL (ref 3.8–10.8)
WBCMIX: 592 {cells}/uL (ref 200–950)

## 2017-08-10 LAB — LIPID PANEL
CHOL/HDL RATIO: 3.6 (calc) (ref <5.0)
CHOLESTEROL: 225 mg/dL — AB (ref <200)
HDL: 63 mg/dL (ref >50)
LDL Cholesterol (Calc): 143 mg/dL (calc) — ABNORMAL HIGH
Non-HDL Cholesterol (Calc): 162 mg/dL (calc) — ABNORMAL HIGH (ref <130)
Triglycerides: 85 mg/dL (ref <150)

## 2017-08-10 MED ORDER — LORAZEPAM 0.5 MG PO TABS: 0.5000 mg | ORAL_TABLET | Freq: Two times a day (BID) | ORAL | 0 refills | Status: DC | PRN

## 2017-08-10 NOTE — Assessment & Plan Note (Signed)
Continue ativan, will also use for her pelvic floor dysfunction, atrophy along with estrogen vaginal

## 2017-11-08 ENCOUNTER — Other Ambulatory Visit: Payer: Self-pay | Admitting: Family Medicine

## 2017-11-08 MED ORDER — LORAZEPAM 0.5 MG PO TABS: 0.5000 mg | ORAL_TABLET | Freq: Two times a day (BID) | ORAL | 0 refills | Status: DC | PRN

## 2017-11-08 NOTE — Telephone Encounter (Signed)
Ok to refill??  Last office visit 08/09/2017.  Last refill 08/10/2017.

## 2018-02-08 DIAGNOSIS — R102 Pelvic and perineal pain: Secondary | ICD-10-CM | POA: Diagnosis not present

## 2018-02-08 DIAGNOSIS — R300 Dysuria: Secondary | ICD-10-CM | POA: Diagnosis not present

## 2018-02-09 ENCOUNTER — Encounter: Payer: Self-pay | Admitting: Family Medicine

## 2018-02-12 ENCOUNTER — Encounter: Payer: Self-pay | Admitting: Family Medicine

## 2018-02-12 MED ORDER — LORAZEPAM 0.5 MG PO TABS: 0.5000 mg | ORAL_TABLET | Freq: Two times a day (BID) | ORAL | 0 refills | Status: DC | PRN

## 2018-02-12 NOTE — Telephone Encounter (Signed)
Ok to refill??  Last office visit 08/09/2017.  Last refill 11/08/2017.

## 2018-02-22 DIAGNOSIS — N3080 Other cystitis without hematuria: Secondary | ICD-10-CM | POA: Diagnosis not present

## 2018-02-22 DIAGNOSIS — M6289 Other specified disorders of muscle: Secondary | ICD-10-CM | POA: Diagnosis not present

## 2018-02-22 DIAGNOSIS — M62838 Other muscle spasm: Secondary | ICD-10-CM | POA: Diagnosis not present

## 2018-02-22 DIAGNOSIS — M6281 Muscle weakness (generalized): Secondary | ICD-10-CM | POA: Diagnosis not present

## 2018-03-08 DIAGNOSIS — R300 Dysuria: Secondary | ICD-10-CM | POA: Diagnosis not present

## 2018-03-08 DIAGNOSIS — M62838 Other muscle spasm: Secondary | ICD-10-CM | POA: Diagnosis not present

## 2018-03-08 DIAGNOSIS — M6281 Muscle weakness (generalized): Secondary | ICD-10-CM | POA: Diagnosis not present

## 2018-03-08 DIAGNOSIS — M6289 Other specified disorders of muscle: Secondary | ICD-10-CM | POA: Diagnosis not present

## 2018-04-09 ENCOUNTER — Other Ambulatory Visit: Payer: Self-pay

## 2018-04-09 ENCOUNTER — Encounter: Payer: Self-pay | Admitting: Family Medicine

## 2018-04-09 ENCOUNTER — Ambulatory Visit: Payer: BLUE CROSS/BLUE SHIELD | Admitting: Family Medicine

## 2018-04-09 VITALS — BP 116/68 | HR 80 | Temp 97.9°F | Resp 14 | Ht 62.0 in | Wt 158.0 lb

## 2018-04-09 DIAGNOSIS — F411 Generalized anxiety disorder: Secondary | ICD-10-CM

## 2018-04-09 DIAGNOSIS — F430 Acute stress reaction: Secondary | ICD-10-CM

## 2018-04-09 MED ORDER — TRAZODONE HCL 50 MG PO TABS: 25.0000 mg | ORAL_TABLET | Freq: Every evening | ORAL | 3 refills | Status: DC | PRN

## 2018-04-09 MED ORDER — CYCLOBENZAPRINE HCL 5 MG PO TABS: ORAL_TABLET | ORAL | 2 refills | Status: DC

## 2018-04-09 MED ORDER — LORAZEPAM 0.5 MG PO TABS: 0.5000 mg | ORAL_TABLET | Freq: Two times a day (BID) | ORAL | 0 refills | Status: DC | PRN

## 2018-04-09 NOTE — Progress Notes (Signed)
    Subjective:    Patient ID: Ashley [name], female    DOB: 06-03-1965, 53 y.o.   MRN: [id number]  Patient presents for Anxiety (needs FMLA) Patient here with increased anxiety and stressors.  She has underlying generalized anxiety disorder has been on Lorazepam for a few years has maintained her mood.  However the past couple months her mother's health has declined.  Her mother has Parkinson's dementia and was in a nursing facility where she developed a bedsore she then became septic and has been in the ICU for over a month now.  She recently found out yesterday that the infection has spread to her bones and they are asking the family to get things in order as her illness is progressive.  She finds herself having anxiety attacks crying all the time she is not sleeping well she just feels overwhelmed with everything.  She is having to assist her mother make medical decisions as well as work full-time.  She is requesting FMLA to assist with this transition.    Review Of Systems:  GEN- denies fatigue, fever, weight loss,weakness, recent illness HEENT- denies eye drainage, change in vision, nasal discharge, CVS- denies chest pain, palpitations RESP- denies SOB, cough, wheeze ABD- denies N/V, change in stools, abd pain GU- denies dysuria, hematuria, dribbling, incontinence MSK- denies joint pain, muscle aches, injury Neuro- denies headache, dizziness, syncope, seizure activity       Objective:    BP 116/68   Pulse 80   Temp 97.9 F (36.6 C) (Oral)   Resp 14   Ht 5\' 2"  (1.575 m)   Wt 158 lb (71.7 kg)   SpO2 97%   BMI 28.90 kg/m  GEN- NAD, alert and oriented x3 Psych- tearful, well groomed, good eye contact, anxious appearing, no apparent response to external stimuli         Assessment & Plan:      Problem List Items Addressed This Visit      Unprioritized   Anxiety state - Primary    Increased anxiety in the setting of worsening of her mother's illness which may be  terminal.  She is having to work full-time and be power of attorney be at the hospital to help make medical decisions for her mother which is been very overwhelming and increase her anxiety.  She is also not sleeping well.  We will start her on trazodone 25 mg at bedtime she will continue the lorazepam twice a day as needed.  Of note she does have cyclobenzaprine she knows not to take this with her been so this is for her cystitis which is been flaring since her stress level has been high.  I did complete FMLA Family is getting some therapy counseling from the staff at the hospital      Relevant Medications   LORazepam (ATIVAN) 0.5 MG tablet   traZODone (DESYREL) 50 MG tablet    Other Visit Diagnoses    Acute reaction to situational stress       Relevant Medications   LORazepam (ATIVAN) 0.5 MG tablet   traZODone (DESYREL) 50 MG tablet      Note: This dictation was prepared with Dragon dictation along with smaller phrase technology. Any transcriptional errors that result from this process are unintentional.

## 2018-04-09 NOTE — Assessment & Plan Note (Addendum)
Increased anxiety in the setting of worsening of her mother's illness which may be terminal.  She is having to work full-time and be power of attorney be at the hospital to help make medical decisions for her mother which is been very overwhelming and increase her anxiety.  She is also not sleeping well.  We will start her on trazodone 25 mg at bedtime she will continue the lorazepam twice a day as needed.  Of note she does have cyclobenzaprine she knows not to take this with her been so this is for her cystitis which is been flaring since her stress level has been high.  I did complete FMLA Family is getting some therapy counseling from the staff at the hospital

## 2018-04-09 NOTE — Patient Instructions (Addendum)
F/U June for Physical  

## 2018-05-09 ENCOUNTER — Ambulatory Visit: Payer: BLUE CROSS/BLUE SHIELD | Admitting: Urology

## 2018-05-09 DIAGNOSIS — R102 Pelvic and perineal pain: Secondary | ICD-10-CM

## 2018-05-09 DIAGNOSIS — R300 Dysuria: Secondary | ICD-10-CM | POA: Diagnosis not present

## 2018-08-07 ENCOUNTER — Other Ambulatory Visit (HOSPITAL_COMMUNITY): Payer: Self-pay | Admitting: Family Medicine

## 2018-08-07 DIAGNOSIS — Z1231 Encounter for screening mammogram for malignant neoplasm of breast: Secondary | ICD-10-CM

## 2018-08-13 ENCOUNTER — Other Ambulatory Visit: Payer: Self-pay

## 2018-08-13 ENCOUNTER — Ambulatory Visit (HOSPITAL_COMMUNITY)
Admission: RE | Admit: 2018-08-13 | Discharge: 2018-08-13 | Disposition: A | Discharge status: Home / Self Care | Payer: BC Managed Care – PPO | Source: Ambulatory Visit | Attending: Family Medicine | Admitting: Family Medicine

## 2018-08-13 DIAGNOSIS — Z1231 Encounter for screening mammogram for malignant neoplasm of breast: Secondary | ICD-10-CM

## 2018-08-14 ENCOUNTER — Encounter: Payer: Self-pay | Admitting: Family Medicine

## 2018-08-14 ENCOUNTER — Ambulatory Visit (INDEPENDENT_AMBULATORY_CARE_PROVIDER_SITE_OTHER): Payer: BC Managed Care – PPO | Admitting: Family Medicine

## 2018-08-14 ENCOUNTER — Other Ambulatory Visit: Payer: Self-pay

## 2018-08-14 VITALS — BP 132/82 | HR 86 | Temp 97.4°F | Resp 18 | Ht 62.0 in | Wt 162.4 lb

## 2018-08-14 DIAGNOSIS — E7800 Pure hypercholesterolemia, unspecified: Secondary | ICD-10-CM

## 2018-08-14 DIAGNOSIS — N952 Postmenopausal atrophic vaginitis: Secondary | ICD-10-CM

## 2018-08-14 DIAGNOSIS — F411 Generalized anxiety disorder: Secondary | ICD-10-CM | POA: Diagnosis not present

## 2018-08-14 DIAGNOSIS — Z0000 Encounter for general adult medical examination without abnormal findings: Secondary | ICD-10-CM | POA: Diagnosis not present

## 2018-08-14 DIAGNOSIS — Z0001 Encounter for general adult medical examination with abnormal findings: Secondary | ICD-10-CM | POA: Diagnosis not present

## 2018-08-14 NOTE — Patient Instructions (Addendum)
Shingrix sent to pharmacy  We will call with lab results  F/U 1 YEAR FOR physical

## 2018-08-14 NOTE — Progress Notes (Signed)
   Subjective:    Patient ID: Ashley [name], female    DOB: December 23, 1965, 53 y.o.   MRN: [id number]  Patient presents for Annual Exam She here for complete physical exam.  Medications reviewed.  history updated  Prevention -immunizations up-to-date/discussed shingles vaccines.  Colonoscopy up-to-date 2019 mammogram done yesterday-normal   Discussed HIV screening   Started on trazodone back in February the setting of her mother's declining health.  She was also working full-time at that time.  She was continued on her Lorazepam which she was already using as needed for anxiety.  Is getting therapy with the family during this time. Trazodone did not help she has stopped this   Pelvic floor dysfunction and post menopausal atropic vaginitis- using estrace cream as needed   Mild hyperlipidemia- treating with fish oil and diet   Pt husband works at [employer] in [place], he was positive May 25th   health dept and [employer] involved  Pt swabbed on  5/25-  At [facility] in [place], 2nd test done 5/28th both were negative    She has been quarantined due to husband positive status since since  07/24/18  Has forms for HR and short term disability  She never had symptoms        Review Of Systems:  GEN- denies fatigue, fever, weight loss,weakness, recent illness HEENT- denies eye drainage, change in vision, nasal discharge, CVS- denies chest pain, palpitations RESP- denies SOB, cough, wheeze ABD- denies N/V, change in stools, abd pain GU- denies dysuria, hematuria, dribbling, incontinence MSK- denies joint pain, muscle aches, injury Neuro- denies headache, dizziness, syncope, seizure activity       Objective:    BP 132/82   Pulse 86   Temp (!) 97.4 F (36.3 C)   Resp 18   Ht 5\' 2"  (1.575 m)   Wt 162 lb 6.4 oz (73.7 kg)   SpO2 97%   BMI 29.70 kg/m  GEN- NAD, alert and oriented x3 HEENT- PERRL, EOMI, non injected sclera, pink conjunctiva, MMM, oropharynx clear Neck- Supple, no  thyromegaly CVS- RRR, no murmur RESP-CTAB ABD-NABS,soft,NT,ND EXT- No edema Pulses- Radial, DP- 2+        Assessment & Plan:      Problem List Items Addressed This Visit      Unprioritized   Anxiety state    Patient current medications.  Now that her husband has improved her anxiety has improved in general.      Hyperlipidemia    Check fasting labs for her cholesterol.      Relevant Orders   Lipid Panel   HIV Antibody (routine testing w rflx)   Vaginal atrophy    Continue current topical hormone therapy.       Other Visit Diagnoses    Encounter for annual physical exam    -  Primary   Gold done.  Reviewed mammogram.  Preventative- shingrix to pharmacy  She is okay to be released back to work she has completed her quarantine   Relevant Orders   Comprehensive metabolic panel   Lipid Panel   CBC with Differential/Platelet   HIV Antibody (routine testing w rflx)      Note: This dictation was prepared with Dragon dictation along with smaller phrase technology. Any transcriptional errors that result from this process are unintentional.

## 2018-08-14 NOTE — Assessment & Plan Note (Signed)
Check fasting labs for her cholesterol.

## 2018-08-14 NOTE — Assessment & Plan Note (Signed)
Continue current topical hormone therapy.

## 2018-08-14 NOTE — Assessment & Plan Note (Signed)
Patient current medications.  Now that her husband has improved her anxiety has improved in general.

## 2018-08-15 ENCOUNTER — Encounter: Payer: Self-pay | Admitting: Family Medicine

## 2018-08-15 ENCOUNTER — Telehealth: Payer: Self-pay | Admitting: *Deleted

## 2018-08-15 LAB — CBC WITH DIFFERENTIAL/PLATELET
Absolute Monocytes: 301 cells/uL (ref 200–950)
Basophils Absolute: 39 cells/uL (ref 0–200)
Basophils Relative: 0.9 %
Eosinophils Absolute: 69 cells/uL (ref 15–500)
Eosinophils Relative: 1.6 %
HCT: 39.5 % (ref 35.0–45.0)
Hemoglobin: 13.6 g/dL (ref 11.7–15.5)
Lymphs Abs: 1793 cells/uL (ref 850–3900)
MCH: 32.9 pg (ref 27.0–33.0)
MCHC: 34.4 g/dL (ref 32.0–36.0)
MCV: 95.6 fL (ref 80.0–100.0)
MPV: 10.4 fL (ref 7.5–12.5)
Monocytes Relative: 7 %
Neutro Abs: 2098 cells/uL (ref 1500–7800)
Neutrophils Relative %: 48.8 %
Platelets: 277 10*3/uL (ref 140–400)
RBC: 4.13 10*6/uL (ref 3.80–5.10)
RDW: 12.3 % (ref 11.0–15.0)
Total Lymphocyte: 41.7 %
WBC: 4.3 10*3/uL (ref 3.8–10.8)

## 2018-08-15 LAB — COMPREHENSIVE METABOLIC PANEL
AG Ratio: 1.4 (calc) (ref 1.0–2.5)
ALT: 20 U/L (ref 6–29)
AST: 23 U/L (ref 10–35)
Albumin: 4.2 g/dL (ref 3.6–5.1)
Alkaline phosphatase (APISO): 85 U/L (ref 37–153)
BUN: 10 mg/dL (ref 7–25)
CO2: 23 mmol/L (ref 20–32)
Calcium: 9.3 mg/dL (ref 8.6–10.4)
Chloride: 103 mmol/L (ref 98–110)
Creat: 0.75 mg/dL (ref 0.50–1.05)
Globulin: 2.9 g/dL (calc) (ref 1.9–3.7)
Glucose, Bld: 103 mg/dL — ABNORMAL HIGH (ref 65–99)
Potassium: 4.7 mmol/L (ref 3.5–5.3)
Sodium: 138 mmol/L (ref 135–146)
Total Bilirubin: 0.3 mg/dL (ref 0.2–1.2)
Total Protein: 7.1 g/dL (ref 6.1–8.1)

## 2018-08-15 LAB — LIPID PANEL
Cholesterol: 237 mg/dL — ABNORMAL HIGH (ref <200)
HDL: 67 mg/dL (ref >=50)
LDL Cholesterol (Calc): 138 mg/dL (calc) — ABNORMAL HIGH
Non-HDL Cholesterol (Calc): 170 mg/dL (calc) — ABNORMAL HIGH (ref <130)
Total CHOL/HDL Ratio: 3.5 (calc) (ref <5.0)
Triglycerides: 181 mg/dL — ABNORMAL HIGH (ref <150)

## 2018-08-15 LAB — HIV ANTIBODY (ROUTINE TESTING W REFLEX): HIV 1&2 Ab, 4th Generation: NONREACTIVE

## 2018-08-15 MED ORDER — ZOSTER VAC RECOMB ADJUVANTED 50 MCG/0.5ML IM SUSR: 0.5000 mL | Freq: Once | INTRAMUSCULAR | 1 refills | Status: AC

## 2018-08-15 NOTE — Telephone Encounter (Signed)
-----   Message from Alycia Rossetti, MD sent at 08/14/2018  5:27 PM EDT ----- Regarding: Send Shingrix to pharmacy

## 2018-08-16 NOTE — Addendum Note (Signed)
Addended by: Sheral Flow on: 08/16/2018 08:31 AM   Modules accepted: Orders

## 2018-08-16 NOTE — Telephone Encounter (Signed)
Ok to refill all meds for 90 days??  Last office visit 08/14/2018  Last refill 04/09/2018 on all meds.

## 2018-08-17 MED ORDER — ESTRADIOL 0.1 MG/GM VA CREA: 1.0000 | TOPICAL_CREAM | VAGINAL | 0 refills | Status: DC

## 2018-08-17 MED ORDER — LORAZEPAM 0.5 MG PO TABS: 0.5000 mg | ORAL_TABLET | Freq: Two times a day (BID) | ORAL | 0 refills | Status: DC | PRN

## 2018-08-17 MED ORDER — CYCLOBENZAPRINE HCL 5 MG PO TABS: ORAL_TABLET | ORAL | 0 refills | Status: DC

## 2018-10-26 ENCOUNTER — Encounter: Payer: Self-pay | Admitting: Family Medicine

## 2018-10-30 ENCOUNTER — Other Ambulatory Visit: Payer: Self-pay

## 2018-10-31 ENCOUNTER — Ambulatory Visit: Payer: BC Managed Care – PPO | Admitting: Family Medicine

## 2018-10-31 ENCOUNTER — Encounter: Payer: Self-pay | Admitting: Family Medicine

## 2018-10-31 VITALS — BP 112/68 | HR 90 | Temp 97.9°F | Resp 12 | Ht 62.0 in | Wt 168.0 lb

## 2018-10-31 DIAGNOSIS — Z23 Encounter for immunization: Secondary | ICD-10-CM

## 2018-10-31 DIAGNOSIS — F411 Generalized anxiety disorder: Secondary | ICD-10-CM

## 2018-10-31 DIAGNOSIS — F5101 Primary insomnia: Secondary | ICD-10-CM

## 2018-10-31 MED ORDER — FLUOXETINE HCL 20 MG PO TABS: 20.0000 mg | ORAL_TABLET | Freq: Every day | ORAL | 3 refills | Status: DC

## 2018-10-31 MED ORDER — LORAZEPAM 1 MG PO TABS: 1.0000 mg | ORAL_TABLET | Freq: Two times a day (BID) | ORAL | 1 refills | Status: DC | PRN

## 2018-10-31 NOTE — Progress Notes (Signed)
   Subjective:    Patient ID: Ashley [name], female    DOB: 01-14-1966, 53 y.o.   MRN: [id number]  Patient presents for Anxiety (having frequent panic attacks)  Pt here with increased anxiety for past few month,s has tried changing her ativan timing, increasing exercise, chaning diet, but still having daily panic attacks, that last up to 10 minutes feeling like she cant breath, palpitations  she has tried taking 1mg  of ativan and that helped with sleep better  Also caring for mother with Parksinons   would like a change in her therapy   regarding depression on occ feels sad, and tearful, but mostly overwhelmed  she does have support group she is in for her mothers parkinson's  also support from husband     Review Of Systems:  GEN- denies fatigue, fever, weight loss,weakness, recent illness HEENT- denies eye drainage, change in vision, nasal discharge, CVS- denies chest pain, palpitations RESP- denies SOB, cough, wheeze ABD- denies N/V, change in stools, abd pain GU- denies dysuria, hematuria, dribbling, incontinence MSK- denies joint pain, muscle aches, injury Neuro- denies headache, dizziness, syncope, seizure activity       Objective:    BP 112/68   Pulse 90   Temp 97.9 F (36.6 C) (Oral)   Resp 12   Ht 5\' 2"  (1.575 m)   Wt 168 lb (76.2 kg)   SpO2 96%   BMI 30.73 kg/m  GEN- NAD, alert and oriented x3 Psych- very polite, normal affect and mood, well groomed , normal speech    GAD 7 score 19  PHQ-9 Score 2     Assessment & Plan:      Problem List Items Addressed This Visit      Unprioritized   Anxiety state - Primary    Start prozac 20mg  in the morning Will also increase ativan to 1mg  BID to help with sleep, she can take 1/2 during the day if needed      Relevant Medications   LORazepam (ATIVAN) 1 MG tablet   FLUoxetine (PROZAC) 20 MG tablet   Insomnia    Other Visit Diagnoses    Need for immunization against influenza       Relevant Orders   Flu  Vaccine QUAD 36+ mos IM (Completed)      Note: This dictation was prepared with Dragon dictation along with smaller phrase technology. Any transcriptional errors that result from this process are unintentional.

## 2018-10-31 NOTE — Patient Instructions (Signed)
F/U 4 weeks for medications

## 2018-11-01 ENCOUNTER — Encounter: Payer: Self-pay | Admitting: Family Medicine

## 2018-11-01 NOTE — Assessment & Plan Note (Signed)
Start prozac 20mg  in the morning Will also increase ativan to 1mg  BID to help with sleep, she can take 1/2 during the day if needed

## 2018-11-26 ENCOUNTER — Encounter: Payer: Self-pay | Admitting: Family Medicine

## 2018-11-28 ENCOUNTER — Ambulatory Visit: Payer: BC Managed Care – PPO | Admitting: Family Medicine

## 2019-02-05 ENCOUNTER — Other Ambulatory Visit: Payer: Self-pay | Admitting: *Deleted

## 2019-02-05 DIAGNOSIS — Z20822 Contact with and (suspected) exposure to covid-19: Secondary | ICD-10-CM

## 2019-02-07 LAB — NOVEL CORONAVIRUS, NAA: SARS-CoV-2, NAA: NOT DETECTED

## 2019-03-20 ENCOUNTER — Encounter: Payer: Self-pay | Admitting: Family Medicine

## 2019-03-20 MED ORDER — DIAZEPAM 5 MG PO TABS: 5.0000 mg | ORAL_TABLET | Freq: Every evening | ORAL | 1 refills | Status: DC | PRN

## 2019-04-16 ENCOUNTER — Encounter: Payer: Self-pay | Admitting: Family Medicine

## 2019-04-17 ENCOUNTER — Encounter: Payer: Self-pay | Admitting: Family Medicine

## 2019-04-17 ENCOUNTER — Other Ambulatory Visit: Payer: Self-pay

## 2019-04-17 ENCOUNTER — Ambulatory Visit: Payer: BC Managed Care – PPO | Admitting: Family Medicine

## 2019-04-17 VITALS — BP 116/74 | HR 78 | Temp 98.1°F | Resp 14 | Ht 62.0 in | Wt 159.0 lb

## 2019-04-17 DIAGNOSIS — F4321 Adjustment disorder with depressed mood: Secondary | ICD-10-CM

## 2019-04-17 DIAGNOSIS — F411 Generalized anxiety disorder: Secondary | ICD-10-CM | POA: Diagnosis not present

## 2019-04-17 MED ORDER — DULOXETINE HCL 30 MG PO CPEP: 30.0000 mg | ORAL_CAPSULE | Freq: Every day | ORAL | 3 refills | Status: DC

## 2019-04-17 MED ORDER — LORAZEPAM 1 MG PO TABS: 1.0000 mg | ORAL_TABLET | Freq: Three times a day (TID) | ORAL | 1 refills | Status: DC | PRN

## 2019-04-17 NOTE — Patient Instructions (Signed)
F/U 3 weeks telehealth

## 2019-04-17 NOTE — Progress Notes (Signed)
   Subjective:    Patient ID: Ashley [name], female    DOB: Apr 12, 1965, 54 y.o.   MRN: [id number]  Patient presents for Anxiety (loss of parent- grief reaction)  Patient here to discuss her anxiety and depression.  Her mother was at a local nursing facility was placed in hospice care.  She has declined over the past few weeks and she passed away on 04/07/2022.  She had difficulty dealing with grief.  She finds herself crying a lot not sleeping well.  She does have support from her family.  She would like to change her medications to help with her overall increase in dealing with things.  She is getting counseling through hospice since her mother passed away.  She has been taking her lorazepam typically 3 times a day just to help with her nurse.  At our visit in August had actually started her on fluoxetine which she has side effects of GI upset and headache so she stopped it.  Her brother is currently on Cymbalta and that is working well for her.  She also found out that her mother took Cymbalta without any side effects and she would like to try that medication.  Review Of Systems:  GEN- denies fatigue, fever, weight loss,weakness, recent illness HEENT- denies eye drainage, change in vision, nasal discharge, CVS- denies chest pain, palpitations RESP- denies SOB, cough, wheeze ABD- denies N/V, change in stools, abd pain GU- denies dysuria, hematuria, dribbling, incontinence MSK- denies joint pain, muscle aches, injury Neuro- denies headache, dizziness, syncope, seizure activity       Objective:    BP 116/74   Pulse 78   Temp 98.1 F (36.7 C) (Temporal)   Resp 14   Ht 5\' 2"  (1.575 m)   Wt 159 lb (72.1 kg)   SpO2 98%   BMI 29.08 kg/m  GEN- NAD, alert and oriented x3 Psych- tearful at times, normal affect and mood, well groomed, good eye contact, no SI       Assessment & Plan:      Problem List Items Addressed This Visit      Unprioritized   GAD (generalized anxiety disorder)   Increased anxiety and depression symptoms with recent passing of her mother.  We will start her on Cymbalta 30 mg once a day hopefully she will tolerate this better since her other family members have.  We will increase her lorazepam to up to 3 times a day as needed as it will take a couple weeks for the Cymbalta to get in her system.  We will have a telehealth visit in 3 weeks to follow-up for medication.  She will continue with hospice grief counseling.      Relevant Medications   LORazepam (ATIVAN) 1 MG tablet   DULoxetine (CYMBALTA) 30 MG capsule    Other Visit Diagnoses    Grief reaction    -  Primary      Note: This dictation was prepared with Dragon dictation along with smaller phrase technology. Any transcriptional errors that result from this process are unintentional.

## 2019-04-17 NOTE — Assessment & Plan Note (Signed)
Increased anxiety and depression symptoms with recent passing of her mother.  We will start her on Cymbalta 30 mg once a day hopefully she will tolerate this better since her other family members have.  We will increase her lorazepam to up to 3 times a day as needed as it will take a couple weeks for the Cymbalta to get in her system.  We will have a telehealth visit in 3 weeks to follow-up for medication.  She will continue with hospice grief counseling.

## 2019-05-08 ENCOUNTER — Telehealth (INDEPENDENT_AMBULATORY_CARE_PROVIDER_SITE_OTHER): Payer: BC Managed Care – PPO | Admitting: Family Medicine

## 2019-05-08 ENCOUNTER — Encounter: Payer: Self-pay | Admitting: Family Medicine

## 2019-05-08 DIAGNOSIS — F411 Generalized anxiety disorder: Secondary | ICD-10-CM | POA: Diagnosis not present

## 2019-05-08 DIAGNOSIS — F4321 Adjustment disorder with depressed mood: Secondary | ICD-10-CM

## 2019-05-08 MED ORDER — LORAZEPAM 1 MG PO TABS: 1.0000 mg | ORAL_TABLET | Freq: Three times a day (TID) | ORAL | 1 refills | Status: DC | PRN

## 2019-05-08 MED ORDER — DULOXETINE HCL 30 MG PO CPEP: 30.0000 mg | ORAL_CAPSULE | Freq: Every day | ORAL | 3 refills | Status: DC

## 2019-05-08 NOTE — Progress Notes (Signed)
Virtual Visit via Video Note  I connected with Ashley [name] on 05/08/19 at  8:00 AM EST by a video enabled telemedicine application and verified that I am speaking with the correct person using two identifiers.      Pt location: at home   Physician location:  In office, [facility], Milinda Antis MD     On call: patient and physician   I discussed the limitations of evaluation and management by telemedicine and the availability of in person appointments. The patient expressed understanding and agreed to proceed.  History of Present Illness:  Telehealth visit to follow-up medications for depression w/ grief/  anxiety.  Patient was seen 3 weeks ago secondary to increased symptoms with the recent passing of her mother.  She was started on Cymbalta 30 mg once a day.  Her lorazepam dose was also increased to 3 times a day as needed as a bridge with this medication.  She is noticed in the past couple weeks that her symptoms are much improved.  She feels she is smiling more and is able to handle her day in the grief with her mother.  She feels like the medication is definitely making improvement in her symptoms and family members have even noticed an improvement in her mood.  She is still getting grief counseling twice a week with her family members.  She has not noted any significant side effects from the medication.  Her sleep is been improving over the past few days but her work schedule makes it difficult since she switches shifts a lot.  She does not have any new concerns today.   Observations/Objective: No acute distress noted smiling during most of the visit not depressed or anxious appearing normal speech normal affect.  No apparent hallucinations  Assessment and Plan: Major depression with anxiety, grief with recent passing of her mother.  She is doing well on the Cymbalta lorazepam combination.  She is starting to decrease her benzo diazepam use.  We will continue with Cymbalta  at 30 mg once a day.  She will follow-up in 2 months for medication management.  Advised her if she feels her medication is not working as well or she has any concerns to contact us before then.  We will also continue with her grief counseling  Follow Up Instructions:    I discussed the assessment and treatment plan with the patient. The patient was provided an opportunity to ask questions and all were answered. The patient agreed with the plan and demonstrated an understanding of the instructions.   The patient was advised to call back or seek an in-person evaluation if the symptoms worsen or if the condition fails to improve as anticipated.  I provided 8  minutes of non-face-to-face time during this encounter. End Time 8:08am   Milinda Antis, MD

## 2019-05-16 DIAGNOSIS — Z23 Encounter for immunization: Secondary | ICD-10-CM | POA: Diagnosis not present

## 2019-05-28 ENCOUNTER — Encounter: Payer: Self-pay | Admitting: Family Medicine

## 2019-05-28 ENCOUNTER — Telehealth: Payer: Self-pay | Admitting: *Deleted

## 2019-05-28 NOTE — Telephone Encounter (Signed)
noted 

## 2019-05-28 NOTE — Telephone Encounter (Signed)
Received request from patient for FMLA forms.   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   Forms routed to provider.

## 2019-05-28 NOTE — Telephone Encounter (Signed)
Received completed FMLA from provider.   No charge per provider.   Patient contacted to make aware.  

## 2019-06-12 ENCOUNTER — Other Ambulatory Visit: Payer: Self-pay | Admitting: Family Medicine

## 2019-06-15 DIAGNOSIS — Z23 Encounter for immunization: Secondary | ICD-10-CM | POA: Diagnosis not present

## 2019-07-09 ENCOUNTER — Encounter: Payer: Self-pay | Admitting: Family Medicine

## 2019-07-09 DIAGNOSIS — F411 Generalized anxiety disorder: Secondary | ICD-10-CM

## 2019-07-09 DIAGNOSIS — F430 Acute stress reaction: Secondary | ICD-10-CM

## 2019-07-09 DIAGNOSIS — F4321 Adjustment disorder with depressed mood: Secondary | ICD-10-CM

## 2019-07-11 ENCOUNTER — Ambulatory Visit (INDEPENDENT_AMBULATORY_CARE_PROVIDER_SITE_OTHER): Payer: BC Managed Care – PPO | Admitting: Psychologist

## 2019-07-11 DIAGNOSIS — Z634 Disappearance and death of family member: Secondary | ICD-10-CM | POA: Diagnosis not present

## 2019-07-11 DIAGNOSIS — F321 Major depressive disorder, single episode, moderate: Secondary | ICD-10-CM | POA: Diagnosis not present

## 2019-07-15 ENCOUNTER — Ambulatory Visit: Payer: BC Managed Care – PPO | Admitting: Family Medicine

## 2019-07-16 ENCOUNTER — Ambulatory Visit: Payer: BC Managed Care – PPO | Admitting: Family Medicine

## 2019-07-16 ENCOUNTER — Encounter: Payer: Self-pay | Admitting: Family Medicine

## 2019-07-16 ENCOUNTER — Other Ambulatory Visit: Payer: Self-pay

## 2019-07-16 VITALS — BP 126/76 | HR 68 | Temp 98.1°F | Resp 14 | Ht 62.0 in | Wt 167.0 lb

## 2019-07-16 DIAGNOSIS — F321 Major depressive disorder, single episode, moderate: Secondary | ICD-10-CM

## 2019-07-16 DIAGNOSIS — F329 Major depressive disorder, single episode, unspecified: Secondary | ICD-10-CM | POA: Insufficient documentation

## 2019-07-16 DIAGNOSIS — F4320 Adjustment disorder, unspecified: Secondary | ICD-10-CM

## 2019-07-16 DIAGNOSIS — F411 Generalized anxiety disorder: Secondary | ICD-10-CM

## 2019-07-16 DIAGNOSIS — F4321 Adjustment disorder with depressed mood: Secondary | ICD-10-CM

## 2019-07-16 MED ORDER — DULOXETINE HCL 60 MG PO CPEP: 60.0000 mg | ORAL_CAPSULE | Freq: Every day | ORAL | 1 refills | Status: DC

## 2019-07-16 MED ORDER — LORAZEPAM 1 MG PO TABS: 1.0000 mg | ORAL_TABLET | Freq: Three times a day (TID) | ORAL | 1 refills | Status: DC | PRN

## 2019-07-16 NOTE — Progress Notes (Signed)
Subjective:    Patient ID: Ashley [name], female    DOB: [birth year], 54 y.o.   MRN: [id number]  Patient presents for Follow-up (Depression/ Anxiety)   Pt here to f/u medications  Her brother passed away in 2022/06/07 unexpectedly he had a heart attack.  He was 47.  She has had significant grief because this occurred after her mother passing away earlier this year.  She is currently in psychotherapy she had her first session last Thursday she is scheduled for her next session next Thursday   Georgiann Mohs- Tresa Moore , psychologist  She had difficulty focusing and concentrating at work.  She is tearful all the time.  She is not sleeping well she feels like she wants to cry all day.  She is taking her lorazepam 3 times a day just to get her throughout the day.  She is also taking Cymbalta 30 mg once a day.  She feels she needs some time to gather herself and work towards some things. Mother's Day states she had a full-blown anxiety attack she was trying to get dressed for work and just broke down has a call out.  She feels like her brother was just taken from her and she cant wrap her head around things , her husband tries to console her but it is not helping        [email or web address] /  [email or web address]  Fax [phone] Needs  90 days on scripts or meds wont be covered      Review Of Systems:  GEN- denies fatigue, fever, weight loss,weakness, recent illness HEENT- denies eye drainage, change in vision, nasal discharge, CVS- denies chest pain, palpitations RESP- denies SOB, cough, wheeze ABD- denies N/V, change in stools, abd pain GU- denies dysuria, hematuria, dribbling, incontinence MSK- denies joint pain, muscle aches, injury Neuro- denies headache, dizziness, syncope, seizure activity       Objective:    BP 126/76   Pulse 68   Temp 98.1 F (36.7 C) (Temporal)   Resp 14   Ht 5\' 2"  (1.575 m)   Wt 167 lb (75.8 kg)   SpO2 98%   BMI 30.54  kg/m  GEN- NAD, alert and oriented x3 Psych depressed affect tearful entire visit fair eye contact no suicidal ideations no apparent hallucinations normal speech   phq SORE  21  Gad 7- SCORE 21      Assessment & Plan:     Approx 20 minutes spent with pt > 50% on counseling/medication management  Problem List Items Addressed This Visit      Unprioritized   GAD (generalized anxiety disorder)   Relevant Medications   DULoxetine (CYMBALTA) 60 MG capsule   LORazepam (ATIVAN) 1 MG tablet   Grief reaction - Primary   MDD (major depressive disorder)    MDD in the setting of grief reaction with the passing of both her mother and then unexpectedly her younger brother whom she helped raise.  She has had significant difficulty coping with everything and trying to work.  She needs some time out of work which I agree with.  Also increase her Cymbalta to 60 mg once a day.  Continue lorazepam 3 times a day continue with psychotherapy.  We will recheck her in 2 weeks and see if her time out of work needs to be extended for her.      Relevant Medications   DULoxetine (CYMBALTA) 60 MG capsule   LORazepam (ATIVAN) 1 MG tablet  Note: This dictation was prepared with Dragon dictation along with smaller phrase technology. Any transcriptional errors that result from this process are unintentional.

## 2019-07-16 NOTE — Assessment & Plan Note (Signed)
MDD in the setting of grief reaction with the passing of both her mother and then unexpectedly her younger brother whom she helped raise.  She has had significant difficulty coping with everything and trying to work.  She needs some time out of work which I agree with.  Also increase her Cymbalta to 60 mg once a day.  Continue lorazepam 3 times a day continue with psychotherapy.  We will recheck her in 2 weeks and see if her time out of work needs to be extended for her.

## 2019-07-16 NOTE — Patient Instructions (Addendum)
F/U 2 weeks  Increase cymbalta to  60mg  once a day Continue ativan

## 2019-07-25 ENCOUNTER — Ambulatory Visit (INDEPENDENT_AMBULATORY_CARE_PROVIDER_SITE_OTHER): Payer: BC Managed Care – PPO | Admitting: Psychologist

## 2019-07-25 DIAGNOSIS — F321 Major depressive disorder, single episode, moderate: Secondary | ICD-10-CM

## 2019-07-25 DIAGNOSIS — Z634 Disappearance and death of family member: Secondary | ICD-10-CM

## 2019-07-25 DIAGNOSIS — F410 Panic disorder [episodic paroxysmal anxiety] without agoraphobia: Secondary | ICD-10-CM | POA: Diagnosis not present

## 2019-07-30 ENCOUNTER — Encounter: Payer: Self-pay | Admitting: Family Medicine

## 2019-07-30 ENCOUNTER — Ambulatory Visit: Payer: BC Managed Care – PPO | Admitting: Family Medicine

## 2019-07-30 ENCOUNTER — Other Ambulatory Visit: Payer: Self-pay

## 2019-07-30 VITALS — BP 132/84 | HR 80 | Temp 98.4°F | Resp 12 | Ht 62.0 in | Wt 165.0 lb

## 2019-07-30 DIAGNOSIS — F5101 Primary insomnia: Secondary | ICD-10-CM

## 2019-07-30 DIAGNOSIS — F4321 Adjustment disorder with depressed mood: Secondary | ICD-10-CM | POA: Diagnosis not present

## 2019-07-30 DIAGNOSIS — F411 Generalized anxiety disorder: Secondary | ICD-10-CM | POA: Diagnosis not present

## 2019-07-30 DIAGNOSIS — F321 Major depressive disorder, single episode, moderate: Secondary | ICD-10-CM | POA: Diagnosis not present

## 2019-07-30 DIAGNOSIS — F4320 Adjustment disorder, unspecified: Secondary | ICD-10-CM

## 2019-07-30 MED ORDER — TEMAZEPAM 15 MG PO CAPS: 15.0000 mg | ORAL_CAPSULE | Freq: Every evening | ORAL | 1 refills | Status: DC | PRN

## 2019-07-30 NOTE — Patient Instructions (Signed)
F/U 2 weeks

## 2019-07-30 NOTE — Assessment & Plan Note (Addendum)
Major depression with anxiety as well as chronic insomnia.  This is all worse in the setting of grief reaction from the passing of her mother and her brother.  I think that she would benefit from more time out of work.  I will extend her leave for another 2 weeks.  She will have a psychotherapy appointment next Thursday.  I will continue the Cymbalta 60 mg she seems to be doing much better on this dose.  We will add temazepam long-acting benzo at bedtime.  She tried trazodone and over-the-counter such as melatonin in the past with no improvement in her sleep.  Continue lorazepam as needed during the day.  We will follow-up in 2 weeks.

## 2019-07-30 NOTE — Progress Notes (Signed)
   Subjective:    Patient ID: Ashley [name], female    DOB: 05/06/1965, 54 y.o.   MRN: [id number]  Patient presents for Follow-up (is fasting)   Pt here for intermin f/u on medications Currently on Cymbalta which is increased to 60 mg at her visit 2 weeks ago.  She is having difficulty with grief after losing both her mother and her brother within months of a catheter.  She has not been sleeping well she tends to wake up multiple times throughout the night when she just cannot fall asleep.  She tried taking the lorazepam at bedtime but still does not stay asleep.  She is following with psychotherapy she has already had 2 visits.  She has been out of work for the past 2 weeks which has been helpful so that she can have her therapy and also connect with her family and her husband.  She was at birthday party for nephew this past weekend and with so manypeple around it reminded her of the recent funerals and started to panic her sister was able to calm her down and she took a lorazepam which helped. She has however noticed an improvement with the Cymbalta being at 60 mg she does not feel as tearful day to day and is sad as she usually is.  Review Of Systems:  GEN- denies fatigue, fever, weight loss,weakness, recent illness HEENT- denies eye drainage, change in vision, nasal discharge, CVS- denies chest pain, palpitations RESP- denies SOB, cough, wheeze ABD- denies N/V, change in stools, abd pain GU- denies dysuria, hematuria, dribbling, incontinence MSK- denies joint pain, muscle aches, injury Neuro- denies headache, dizziness, syncope, seizure activity       Objective:    BP 132/84   Pulse 80   Temp 98.4 F (36.9 C) (Temporal)   Resp 12   Ht 5\' 2"  (1.575 m)   Wt 165 lb (74.8 kg)   SpO2 97%   BMI 30.18 kg/m  GEN- NAD, alert and oriented x3 Psych-not anxious appearing tearful discussing brother.  No suicidal ideations no hallucinations        Assessment & Plan:      Problem List  Items Addressed This Visit      Unprioritized   GAD (generalized anxiety disorder) - Primary   Grief reaction   Insomnia   MDD (major depressive disorder)    Major depression with anxiety as well as chronic insomnia.  This is all worse in the setting of grief reaction from the passing of her mother and her brother.  I think that she would benefit from more time out of work.  I will extend her leave for another 2 weeks.  She will have a psychotherapy appointment next Thursday.  I will continue the Cymbalta 60 mg she seems to be doing much better on this dose.  We will add temazepam long-acting benzo at bedtime.  She tried trazodone and over-the-counter such as melatonin in the past with no improvement in her sleep.  Continue lorazepam as needed during the day.  We will follow-up in 2 weeks.         Note: This dictation was prepared with Dragon dictation along with smaller phrase technology. Any transcriptional errors that result from this process are unintentional.

## 2019-08-08 ENCOUNTER — Ambulatory Visit (INDEPENDENT_AMBULATORY_CARE_PROVIDER_SITE_OTHER): Payer: BC Managed Care – PPO | Admitting: Psychologist

## 2019-08-08 DIAGNOSIS — Z634 Disappearance and death of family member: Secondary | ICD-10-CM

## 2019-08-08 DIAGNOSIS — F410 Panic disorder [episodic paroxysmal anxiety] without agoraphobia: Secondary | ICD-10-CM | POA: Diagnosis not present

## 2019-08-08 DIAGNOSIS — F321 Major depressive disorder, single episode, moderate: Secondary | ICD-10-CM | POA: Diagnosis not present

## 2019-08-12 ENCOUNTER — Other Ambulatory Visit: Payer: Self-pay

## 2019-08-12 ENCOUNTER — Ambulatory Visit: Payer: BC Managed Care – PPO | Admitting: Family Medicine

## 2019-08-12 ENCOUNTER — Encounter: Payer: Self-pay | Admitting: Family Medicine

## 2019-08-12 VITALS — BP 120/70 | HR 66 | Temp 98.2°F | Resp 14 | Ht 62.0 in | Wt 164.0 lb

## 2019-08-12 DIAGNOSIS — F4321 Adjustment disorder with depressed mood: Secondary | ICD-10-CM

## 2019-08-12 DIAGNOSIS — F411 Generalized anxiety disorder: Secondary | ICD-10-CM | POA: Diagnosis not present

## 2019-08-12 DIAGNOSIS — F321 Major depressive disorder, single episode, moderate: Secondary | ICD-10-CM

## 2019-08-12 DIAGNOSIS — F4320 Adjustment disorder, unspecified: Secondary | ICD-10-CM

## 2019-08-12 DIAGNOSIS — F5101 Primary insomnia: Secondary | ICD-10-CM

## 2019-08-12 NOTE — Patient Instructions (Signed)
F/U as scheduled in August

## 2019-08-12 NOTE — Assessment & Plan Note (Signed)
Symptoms much improved.  She will continue with psychotherapy as scheduled.  Continue Cymbalta 60 mg once a day.  Temazepam as needed insomnia which she does not use every night.  Otherwise use lorazepam as needed for anxiety and sleep.  She is cleared to return to work on Monday with no restrictions.  Follow-up in 2 months at her annual physical.  She will contact me if there are any concerns with her medications or decline in symptoms.

## 2019-08-12 NOTE — Progress Notes (Signed)
   Subjective:    Patient ID: Ashley [name], female    DOB: 04/02/1965, 54 y.o.   MRN: [id number]  Patient presents for Follow-up (is fasting)  Patient here for interim follow-up on her depression anxiety in the setting of grief reaction.  She is currently followed by her psychotherapist.  Her next appointment is on the 22nd.  She feels like the weight has been lifted off her shoulders will like she is handling the grief much better.  She is doing well with the Cymbalta 60 mg once a day feels like control her depression symptoms.  She is sleeping better she just uses temazepam as needed for she will use the lorazepam she feels her anxiety is high.  She is not had any side effects with any of the medications.  She is ready to return to work on Monday. Does tell me that her uncle passed away this weekend and they have a funeral tomorrow but she feels with the group counseling that she is already receiving her medications she has been able to process that without difficulty.  No new concerns today.   Review Of Systems:  GEN- denies fatigue, fever, weight loss,weakness, recent illness HEENT- denies eye drainage, change in vision, nasal discharge, CVS- denies chest pain, palpitations RESP- denies SOB, cough, wheeze ABD- denies N/V, change in stools, abd pain Neuro- denies headache, dizziness, syncope, seizure activity       Objective:    BP 120/70   Pulse 66   Temp 98.2 F (36.8 C) (Temporal)   Resp 14   Ht 5\' 2"  (1.575 m)   Wt 164 lb (74.4 kg)   SpO2 98%   BMI 30.00 kg/m  GEN- NAD, alert and oriented x3 CVS- RRR, no murmur RESP-CTAB Psych- normal affect and mood, smiling , well groomed  PHQ 9 score 11         Assessment & Plan:   Approx 15 minutes spent with pt  > 50% on counseling/medication management   Problem List Items Addressed This Visit      Unprioritized   GAD (generalized anxiety disorder)   Grief reaction - Primary   Insomnia   MDD (major depressive disorder)     Symptoms much improved.  She will continue with psychotherapy as scheduled.  Continue Cymbalta 60 mg once a day.  Temazepam as needed insomnia which she does not use every night.  Otherwise use lorazepam as needed for anxiety and sleep.  She is cleared to return to work on Monday with no restrictions.  Follow-up in 2 months at her annual physical.  She will contact me if there are any concerns with her medications or decline in symptoms.         Note: This dictation was prepared with Dragon dictation along with smaller phrase technology. Any transcriptional errors that result from this process are unintentional.

## 2019-08-27 ENCOUNTER — Ambulatory Visit (INDEPENDENT_AMBULATORY_CARE_PROVIDER_SITE_OTHER): Payer: BC Managed Care – PPO | Admitting: Psychologist

## 2019-08-27 DIAGNOSIS — F410 Panic disorder [episodic paroxysmal anxiety] without agoraphobia: Secondary | ICD-10-CM | POA: Diagnosis not present

## 2019-08-27 DIAGNOSIS — F321 Major depressive disorder, single episode, moderate: Secondary | ICD-10-CM

## 2019-08-27 DIAGNOSIS — Z634 Disappearance and death of family member: Secondary | ICD-10-CM

## 2019-09-13 ENCOUNTER — Other Ambulatory Visit (HOSPITAL_COMMUNITY): Payer: Self-pay | Admitting: Family Medicine

## 2019-09-13 DIAGNOSIS — Z1231 Encounter for screening mammogram for malignant neoplasm of breast: Secondary | ICD-10-CM

## 2019-09-18 ENCOUNTER — Other Ambulatory Visit: Payer: Self-pay

## 2019-09-18 ENCOUNTER — Ambulatory Visit (HOSPITAL_COMMUNITY)
Admission: RE | Admit: 2019-09-18 | Discharge: 2019-09-18 | Disposition: A | Discharge status: Home / Self Care | Payer: BC Managed Care – PPO | Source: Ambulatory Visit | Attending: Family Medicine | Admitting: Family Medicine

## 2019-09-18 DIAGNOSIS — Z1231 Encounter for screening mammogram for malignant neoplasm of breast: Secondary | ICD-10-CM | POA: Insufficient documentation

## 2019-09-27 ENCOUNTER — Ambulatory Visit (INDEPENDENT_AMBULATORY_CARE_PROVIDER_SITE_OTHER): Payer: BC Managed Care – PPO | Admitting: Psychologist

## 2019-09-27 DIAGNOSIS — F410 Panic disorder [episodic paroxysmal anxiety] without agoraphobia: Secondary | ICD-10-CM | POA: Diagnosis not present

## 2019-09-27 DIAGNOSIS — F321 Major depressive disorder, single episode, moderate: Secondary | ICD-10-CM

## 2019-09-27 DIAGNOSIS — Z634 Disappearance and death of family member: Secondary | ICD-10-CM

## 2019-10-22 ENCOUNTER — Encounter: Payer: Self-pay | Admitting: Family Medicine

## 2019-10-22 ENCOUNTER — Other Ambulatory Visit: Payer: Self-pay

## 2019-10-22 ENCOUNTER — Ambulatory Visit (INDEPENDENT_AMBULATORY_CARE_PROVIDER_SITE_OTHER): Payer: BC Managed Care – PPO | Admitting: Family Medicine

## 2019-10-22 VITALS — BP 130/82 | HR 81 | Temp 97.6°F | Resp 18 | Ht 62.0 in | Wt 171.2 lb

## 2019-10-22 DIAGNOSIS — Z0000 Encounter for general adult medical examination without abnormal findings: Secondary | ICD-10-CM | POA: Diagnosis not present

## 2019-10-22 DIAGNOSIS — Z0001 Encounter for general adult medical examination with abnormal findings: Secondary | ICD-10-CM

## 2019-10-22 DIAGNOSIS — F321 Major depressive disorder, single episode, moderate: Secondary | ICD-10-CM | POA: Diagnosis not present

## 2019-10-22 DIAGNOSIS — F411 Generalized anxiety disorder: Secondary | ICD-10-CM | POA: Diagnosis not present

## 2019-10-22 DIAGNOSIS — Z1159 Encounter for screening for other viral diseases: Secondary | ICD-10-CM

## 2019-10-22 DIAGNOSIS — E7800 Pure hypercholesterolemia, unspecified: Secondary | ICD-10-CM

## 2019-10-22 DIAGNOSIS — R635 Abnormal weight gain: Secondary | ICD-10-CM

## 2019-10-22 DIAGNOSIS — F5101 Primary insomnia: Secondary | ICD-10-CM

## 2019-10-22 NOTE — Progress Notes (Signed)
Subjective:    Patient ID: Ashley [name], female    DOB: [birth year], 54 y.o.   MRN: [id number]  Patient presents for Annual Exam  Patient here for complete physical exam.  Medications reviewed.  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  She is currently on Cymbalta 60 mg once a day.  She takes temazepam as needed for sleep but very rarely .  She still has her lorazepam which she uses also for anxiety.  She is now only in therapy as needed Her mood is improved, but she noticed that the Cymbalta makes her tired during the day. 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 Wanted to know if she can switch to nights when she goes to first shift ( typically stays on a shift for 4 weeks)  She also noticed some wieght gaim, weight up 4lbs since May  She has not changed anything in her diet But she is not as active When she lost weight last year, she was not eating as much and helping her mother out a lot      Immunizations- COVID/TDAP UTD  Scheduled for flu shot Sept 29th at work  Mammogram UTD Colonoscopy UTD S/P Hysterectomy  NO PAP   Discussed Hep C screening   Due for fasting labs, elevated lipids, with LDL  138 and TG 181 in June 2020  UTD with eye doctor and dentist    Review Of Systems:  GEN- denies fatigue, fever, weight loss,weakness, recent illness HEENT- denies eye drainage, change in vision, nasal discharge, CVS- denies chest pain, palpitations RESP- denies SOB, cough, wheeze ABD- denies N/V, change in stools, abd pain GU- denies dysuria, hematuria, dribbling, incontinence MSK- denies joint pain, muscle aches, injury Neuro- denies headache, dizziness, syncope, seizure activity       Objective:    BP 130/82 (BP Location: Right Arm, Patient Position: Sitting, Cuff Size: Normal)   Pulse 81   Temp 97.6 F (36.4 C) (Temporal)   Resp 18   Ht 5\' 2"  (1.575 m)   Wt 171 lb 3.2 oz (77.7 kg)    SpO2 95%   BMI 31.31 kg/m  GEN- NAD, alert and oriented x3 HEENT- PERRL, EOMI, non injected sclera, pink conjunctiva, MMM, oropharynx clear Neck- Supple, no thyromegaly CVS- RRR, no murmur RESP-CTAB ABD-NABS,soft,NT,ND Psych- normal affect and mood, pleasant , GAD  7 score 6, PHQ 9 score 6  EXT- No edema Pulses- Radial, DP- 2+        Assessment & Plan:      Problem List Items Addressed This Visit      Unprioritized   GAD (generalized anxiety disorder)    Prn ativan       Hyperlipidemia   Relevant Orders   Lipid panel   Insomnia    Prn restoril      MDD (major depressive disorder)    Depression symptoms increased are much better.  She feels like she has a better handle on things.  She is now seeing her therapist as needed.  Even though she has noted some weight gain a few pounds in the past couple months prefer to stay on the Cymbalta since it is controlling her mood.  I will check some other labs ensure that there is nothing metabolic contributing to her weight gain as well.  We discussed increasing activity outside of work increasing her water and trying to reduce some of  the carbohydrates and sweets. Follow-up in 4 months.       Other Visit Diagnoses    Routine general medical examination at a health care facility    -  Primary   Flu shot at work, fasting labs, Hep C screening done    Relevant Orders   CBC with Differential/Platelet   Comprehensive metabolic panel   Lipid panel   Need for hepatitis C screening test       Relevant Orders   Hepatitis C antibody   Weight gain       Relevant Orders   TSH      Note: This dictation was prepared with Dragon dictation along with smaller phrase technology. Any transcriptional errors that result from this process are unintentional.

## 2019-10-22 NOTE — Assessment & Plan Note (Signed)
Depression symptoms increased are much better.  She feels like she has a better handle on things.  She is now seeing her therapist as needed.  Even though she has noted some weight gain a few pounds in the past couple months prefer to stay on the Cymbalta since it is controlling her mood.  I will check some other labs ensure that there is nothing metabolic contributing to her weight gain as well.  We discussed increasing activity outside of work increasing her water and trying to reduce some of the carbohydrates and sweets. Follow-up in 4 months.

## 2019-10-22 NOTE — Assessment & Plan Note (Signed)
Prn restoril

## 2019-10-22 NOTE — Patient Instructions (Addendum)
F/U 4 months  We will call with lab results  

## 2019-10-22 NOTE — Assessment & Plan Note (Signed)
Resume home medications including wellbutrin, buspar, gabapentin qHS, and trazodone qHS.  

## 2019-10-23 LAB — CBC WITH DIFFERENTIAL/PLATELET
Absolute Monocytes: 567 cells/uL (ref 200–950)
Basophils Absolute: 69 cells/uL (ref 0–200)
Basophils Relative: 1.1 %
Eosinophils Absolute: 397 cells/uL (ref 15–500)
Eosinophils Relative: 6.3 %
HCT: 42.2 % (ref 35.0–45.0)
Hemoglobin: 14.3 g/dL (ref 11.7–15.5)
Lymphs Abs: 1997 cells/uL (ref 850–3900)
MCH: 33.6 pg — ABNORMAL HIGH (ref 27.0–33.0)
MCHC: 33.9 g/dL (ref 32.0–36.0)
MCV: 99.1 fL (ref 80.0–100.0)
MPV: 9.9 fL (ref 7.5–12.5)
Monocytes Relative: 9 %
Neutro Abs: 3270 cells/uL (ref 1500–7800)
Neutrophils Relative %: 51.9 %
Platelets: 306 10*3/uL (ref 140–400)
RBC: 4.26 10*6/uL (ref 3.80–5.10)
RDW: 12.2 % (ref 11.0–15.0)
Total Lymphocyte: 31.7 %
WBC: 6.3 10*3/uL (ref 3.8–10.8)

## 2019-10-23 LAB — COMPREHENSIVE METABOLIC PANEL
AG Ratio: 1.4 (calc) (ref 1.0–2.5)
ALT: 29 U/L (ref 6–29)
AST: 33 U/L (ref 10–35)
Albumin: 4.1 g/dL (ref 3.6–5.1)
Alkaline phosphatase (APISO): 82 U/L (ref 37–153)
BUN: 17 mg/dL (ref 7–25)
CO2: 26 mmol/L (ref 20–32)
Calcium: 9.9 mg/dL (ref 8.6–10.4)
Chloride: 97 mmol/L — ABNORMAL LOW (ref 98–110)
Creat: 0.84 mg/dL (ref 0.50–1.05)
Globulin: 2.9 g/dL (calc) (ref 1.9–3.7)
Glucose, Bld: 111 mg/dL — ABNORMAL HIGH (ref 65–99)
Potassium: 4.6 mmol/L (ref 3.5–5.3)
Sodium: 133 mmol/L — ABNORMAL LOW (ref 135–146)
Total Bilirubin: 0.3 mg/dL (ref 0.2–1.2)
Total Protein: 7 g/dL (ref 6.1–8.1)

## 2019-10-23 LAB — LIPID PANEL
Cholesterol: 234 mg/dL — ABNORMAL HIGH (ref <200)
HDL: 81 mg/dL (ref >=50)
LDL Cholesterol (Calc): 131 mg/dL (calc) — ABNORMAL HIGH
Non-HDL Cholesterol (Calc): 153 mg/dL (calc) — ABNORMAL HIGH (ref <130)
Total CHOL/HDL Ratio: 2.9 (calc) (ref <5.0)
Triglycerides: 114 mg/dL (ref <150)

## 2019-10-23 LAB — HEPATITIS C ANTIBODY
Hepatitis C Ab: NONREACTIVE
SIGNAL TO CUT-OFF: 0.02 (ref <1.00)

## 2019-10-23 LAB — TSH: TSH: 1.19 mIU/L

## 2019-10-25 DIAGNOSIS — M79672 Pain in left foot: Secondary | ICD-10-CM | POA: Diagnosis not present

## 2019-10-25 DIAGNOSIS — M205X2 Other deformities of toe(s) (acquired), left foot: Secondary | ICD-10-CM | POA: Diagnosis not present

## 2019-10-30 ENCOUNTER — Other Ambulatory Visit: Payer: Self-pay | Admitting: *Deleted

## 2019-10-30 DIAGNOSIS — R7309 Other abnormal glucose: Secondary | ICD-10-CM

## 2019-11-13 ENCOUNTER — Other Ambulatory Visit: Payer: Self-pay | Admitting: Podiatry

## 2019-12-04 DIAGNOSIS — Z23 Encounter for immunization: Secondary | ICD-10-CM | POA: Diagnosis not present

## 2019-12-11 NOTE — Patient Instructions (Signed)
Ashley [name]  12/11/2019     @   Your procedure is scheduled on 12/18/2019.  Report to [facility] at  0830  A.M.  Call this number if you have problems the morning of surgery:  [phone]   Remember:  Do not eat or drink after midnight.                        Take these medicines the morning of surgery with A SIP OF WATER  Cymbalta, ativan, omeprazole.    Do not wear jewelry, make-up or nail polish.  Do not wear lotions, powders, or perfumes. Please wear deodorant and brush your teeth.  Do not shave 48 hours prior to surgery.  Men may shave face and neck.  Do not bring valuables to the hospital.  [facility] is not responsible for any belongings or valuables.  Contacts, dentures or bridgework may not be worn into surgery.  Leave your suitcase in the car.  After surgery it may be brought to your room.  For patients admitted to the hospital, discharge time will be determined by your treatment team.  Patients discharged the day of surgery will not be allowed to drive home.   Name and phone number of your driver:   family Special instructions:  DO NOT smoke the morning of your procedure.  Please read over the following fact sheets that you were given. Anesthesia Post-op Instructions and Care and Recovery After Surgery       Metatarsal Osteotomy, Care After This sheet gives you information about how to care for yourself after your procedure. Your health care provider may also give you more specific instructions. If you have problems or questions, contact your health care provider. What can I expect after the procedure? After the procedure, it is common to have:  Soreness.  Pain.  Stiffness.  Swelling. Follow these instructions at home: Medicines  Take over-the-counter and prescription medicines only as told by your health care provider.  Ask your health care provider if the medicine prescribed to you can cause constipation. You may  need to take steps to prevent or treat constipation, such as: ? Drink enough fluid to keep your urine pale yellow. ? Take over-the-counter or prescription medicines. ? Eat foods that are high in fiber, such as beans, whole grains, and fresh fruits and vegetables. ? Limit foods that are high in fat and processed sugars, such as fried or sweet foods. If you have a splint or walking boot:  Wear it as told by your health care provider. Remove it only as told by your health care provider.  Loosen it if your toes tingle, become numb, or turn cold and blue.  Keep it clean.  If it is not waterproof: ? Do not let it get wet. ? Cover it with a watertight covering when you take a bath or shower. Bathing  Do not take baths, swim, or use a hot tub until your health care provider approves. Ask your health care provider if you may take showers. You may only be allowed to take sponge baths.  Keep the bandage (dressing) dry. Incision care   Follow instructions from your health care provider about how to take care of your incision. Make sure you: ? Wash your hands with soap and water before you change your bandage (dressing). If soap and water are not available, use hand sanitizer. ? Change your dressing as told by your  health care provider. ? Leave stitches (sutures), skin glue, or adhesive strips in place. These skin closures may need to stay in place for 2 weeks or longer. If adhesive strip edges start to loosen and curl up, you may trim the loose edges. Do not remove adhesive strips completely unless your health care provider tells you to do that.  Check your incision area every day for signs of infection. Check for: ? More redness, swelling, or pain. ? Fluid or blood. ? Warmth. ? Pus or a bad smell. Managing pain, stiffness, and swelling   If directed, put ice on the injured area. ? If you have a removable splint, remove it as told by your health care provider. ? Put ice in a plastic  bag. ? Place a towel between your skin and the bag. ? Leave the ice on for 20 minutes, 2-3 times a day.  Move your toes often to avoid stiffness and to lessen swelling.  Raise (elevate) the injured area above the level of your heart while you are sitting or lying down. Driving  Do not drive or use heavy machinery while taking prescription pain medicine.  Do not drive for 24 hours if you were given a sedative during your procedure.  Ask your health care provider when it is safe to drive if you have a dressing, splint, special shoe, or walking boot on your foot. General instructions  Do not remove the bandage (dressing) around your foot until directed by your health care provider.  If you were given a splint, special shoe, or walking boot, wear it as told by your health care provider.  Do not use the injured limb to support your body weight until your health care provider says that you can. Use crutches or a walker as told by your health care provider.  Return to your normal activities as told by your health care provider. Ask your health care provider what activities are safe for you.  Do not use any products that contain nicotine or tobacco, such as cigarettes, e-cigarettes, and chewing tobacco. These can delay bone healing. If you need help quitting, ask your health care provider.  Keep all follow-up visits as told by your health care provider. This is important. Contact a health care provider if:  You have a fever.  Your dressing becomes wet, loose, or stained with blood or discharge.  You have pus or a bad smell coming from your incision or bandage.  Your foot becomes red, swollen, or tender.  You have pain or stiffness that does not get better or gets worse.  You have tingling or numbness in your foot that does not get better or gets worse. Get help right away if:  You develop a warm and tender swelling in your leg.  You have chest pain.  You have trouble  breathing. Summary  After the procedure, it is common to have soreness, pain, stiffness, and swelling.  Follow instructions on caring for your incision, changing your dressing, and using weight support to protect your foot.  Contact your health care provider if you have a fever, pus or a bad smell coming from your wound or dressing, or pain and stiffness do not get better.  Get help right away if you develop a warm and tender swelling in your leg, have chest pain, or trouble breathing. This information is not intended to replace advice given to you by your health care provider. Make sure you discuss any questions you have with your health  care provider. Document Revised: 01/05/2018 Document Reviewed: 01/05/2018 Elsevier Patient Education  2020 [employer]. How to Use Chlorhexidine for Bathing Chlorhexidine gluconate (CHG) is a germ-killing (antiseptic) solution that is used to clean the skin. It can get rid of the bacteria that normally live on the skin and can keep them away for about 24 hours. To clean your skin with CHG, you may be given:  A CHG solution to use in the shower or as part of a sponge bath.  A prepackaged cloth that contains CHG. Cleaning your skin with CHG may help lower the risk for infection:  While you are staying in the intensive care unit of the hospital.  If you have a vascular access, such as a central line, to provide short-term or long-term access to your veins.  If you have a catheter to drain urine from your bladder.  If you are on a ventilator. A ventilator is a machine that helps you breathe by moving air in and out of your lungs.  After surgery. What are the risks? Risks of using CHG include:  A skin reaction.  Hearing loss, if CHG gets in your ears.  Eye injury, if CHG gets in your eyes and is not rinsed out.  The CHG product catching fire. Make sure that you avoid smoking and flames after applying CHG to your skin. Do not use CHG:  If you  have a chlorhexidine allergy or have previously reacted to chlorhexidine.  On babies younger than 70 months of age. How to use CHG solution  Use CHG only as told by your health care provider, and follow the instructions on the label.  Use the full amount of CHG as directed. Usually, this is one bottle. During a shower Follow these steps when using CHG solution during a shower (unless your health care provider gives you different instructions): 1. Start the shower. 2. Use your normal soap and shampoo to wash your face and hair. 3. Turn off the shower or move out of the shower stream. 4. Pour the CHG onto a clean washcloth. Do not use any type of brush or rough-edged sponge. 5. Starting at your neck, lather your body down to your toes. Make sure you follow these instructions: ? If you will be having surgery, pay special attention to the part of your body where you will be having surgery. Scrub this area for at least 1 minute. ? Do not use CHG on your head or face. If the solution gets into your ears or eyes, rinse them well with water. ? Avoid your genital area. ? Avoid any areas of skin that have broken skin, cuts, or scrapes. ? Scrub your back and under your arms. Make sure to wash skin folds. 6. Let the lather sit on your skin for 1-2 minutes or as long as told by your health care provider. 7. Thoroughly rinse your entire body in the shower. Make sure that all body creases and crevices are rinsed well. 8. Dry off with a clean towel. Do not put any substances on your body afterward--such as powder, lotion, or perfume--unless you are told to do so by your health care provider. Only use lotions that are recommended by the manufacturer. 9. Put on clean clothes or pajamas. 10. If it is the night before your surgery, sleep in clean sheets.  During a sponge bath Follow these steps when using CHG solution during a sponge bath (unless your health care provider gives you different  instructions): 1. Use  your normal soap and shampoo to wash your face and hair. 2. Pour the CHG onto a clean washcloth. 3. Starting at your neck, lather your body down to your toes. Make sure you follow these instructions: ? If you will be having surgery, pay special attention to the part of your body where you will be having surgery. Scrub this area for at least 1 minute. ? Do not use CHG on your head or face. If the solution gets into your ears or eyes, rinse them well with water. ? Avoid your genital area. ? Avoid any areas of skin that have broken skin, cuts, or scrapes. ? Scrub your back and under your arms. Make sure to wash skin folds. 4. Let the lather sit on your skin for 1-2 minutes or as long as told by your health care provider. 5. Using a different clean, wet washcloth, thoroughly rinse your entire body. Make sure that all body creases and crevices are rinsed well. 6. Dry off with a clean towel. Do not put any substances on your body afterward--such as powder, lotion, or perfume--unless you are told to do so by your health care provider. Only use lotions that are recommended by the manufacturer. 7. Put on clean clothes or pajamas. 8. If it is the night before your surgery, sleep in clean sheets. How to use CHG prepackaged cloths  Only use CHG cloths as told by your health care provider, and follow the instructions on the label.  Use the CHG cloth on clean, dry skin.  Do not use the CHG cloth on your head or face unless your health care provider tells you to.  When washing with the CHG cloth: ? Avoid your genital area. ? Avoid any areas of skin that have broken skin, cuts, or scrapes. Before surgery Follow these steps when using a CHG cloth to clean before surgery (unless your health care provider gives you different instructions): 1. Using the CHG cloth, vigorously scrub the part of your body where you will be having surgery. Scrub using a back-and-forth motion for 3 minutes.  The area on your body should be completely wet with CHG when you are done scrubbing. 2. Do not rinse. Discard the cloth and let the area air-dry. Do not put any substances on the area afterward, such as powder, lotion, or perfume. 3. Put on clean clothes or pajamas. 4. If it is the night before your surgery, sleep in clean sheets.  For general bathing Follow these steps when using CHG cloths for general bathing (unless your health care provider gives you different instructions). 1. Use a separate CHG cloth for each area of your body. Make sure you wash between any folds of skin and between your fingers and toes. Wash your body in the following order, switching to a new cloth after each step: ? The front of your neck, shoulders, and chest. ? Both of your arms, under your arms, and your hands. ? Your stomach and groin area, avoiding the genitals. ? Your right leg and foot. ? Your left leg and foot. ? The back of your neck, your back, and your buttocks. 2. Do not rinse. Discard the cloth and let the area air-dry. Do not put any substances on your body afterward--such as powder, lotion, or perfume--unless you are told to do so by your health care provider. Only use lotions that are recommended by the manufacturer. 3. Put on clean clothes or pajamas. Contact a health care provider if:  Your skin gets  irritated after scrubbing.  You have questions about using your solution or cloth. Get help right away if:  Your eyes become very red or swollen.  Your eyes itch badly.  Your skin itches badly and is red or swollen.  Your hearing changes.  You have trouble seeing.  You have swelling or tingling in your mouth or throat.  You have trouble breathing.  You swallow any chlorhexidine. Summary  Chlorhexidine gluconate (CHG) is a germ-killing (antiseptic) solution that is used to clean the skin. Cleaning your skin with CHG may help to lower your risk for infection.  You may be given CHG to  use for bathing. It may be in a bottle or in a prepackaged cloth to use on your skin. Carefully follow your health care provider's instructions and the instructions on the product label.  Do not use CHG if you have a chlorhexidine allergy.  Contact your health care provider if your skin gets irritated after scrubbing. This information is not intended to replace advice given to you by your health care provider. Make sure you discuss any questions you have with your health care provider. Document Revised: 05/10/2018 Document Reviewed: 01/19/2017 Elsevier Patient Education  2020 [employer].

## 2019-12-13 DIAGNOSIS — M205X2 Other deformities of toe(s) (acquired), left foot: Secondary | ICD-10-CM | POA: Diagnosis not present

## 2019-12-13 DIAGNOSIS — M79672 Pain in left foot: Secondary | ICD-10-CM | POA: Diagnosis not present

## 2019-12-16 ENCOUNTER — Other Ambulatory Visit (HOSPITAL_COMMUNITY)
Admission: RE | Admit: 2019-12-16 | Discharge: 2019-12-16 | Disposition: A | Discharge status: Home / Self Care | Payer: BC Managed Care – PPO | Source: Ambulatory Visit | Attending: Podiatry | Admitting: Podiatry

## 2019-12-16 ENCOUNTER — Other Ambulatory Visit: Payer: Self-pay

## 2019-12-16 ENCOUNTER — Encounter (HOSPITAL_COMMUNITY): Payer: Self-pay

## 2019-12-16 ENCOUNTER — Ambulatory Visit (HOSPITAL_COMMUNITY)
Admission: RE | Admit: 2019-12-16 | Discharge: 2019-12-16 | Disposition: A | Discharge status: Home / Self Care | Payer: BC Managed Care – PPO | Source: Ambulatory Visit | Attending: Podiatry | Admitting: Podiatry

## 2019-12-16 ENCOUNTER — Encounter (HOSPITAL_COMMUNITY)
Admission: RE | Admit: 2019-12-16 | Discharge: 2019-12-16 | Disposition: A | Discharge status: Home / Self Care | Payer: BC Managed Care – PPO | Source: Ambulatory Visit | Attending: Podiatry | Admitting: Podiatry

## 2019-12-16 DIAGNOSIS — M205X2 Other deformities of toe(s) (acquired), left foot: Secondary | ICD-10-CM | POA: Insufficient documentation

## 2019-12-16 DIAGNOSIS — Z01812 Encounter for preprocedural laboratory examination: Secondary | ICD-10-CM | POA: Insufficient documentation

## 2019-12-16 DIAGNOSIS — M1990 Unspecified osteoarthritis, unspecified site: Secondary | ICD-10-CM | POA: Diagnosis not present

## 2019-12-16 DIAGNOSIS — F32A Depression, unspecified: Secondary | ICD-10-CM | POA: Diagnosis not present

## 2019-12-16 DIAGNOSIS — M79672 Pain in left foot: Secondary | ICD-10-CM | POA: Diagnosis not present

## 2019-12-16 DIAGNOSIS — G8929 Other chronic pain: Secondary | ICD-10-CM | POA: Diagnosis not present

## 2019-12-16 DIAGNOSIS — Z833 Family history of diabetes mellitus: Secondary | ICD-10-CM | POA: Diagnosis not present

## 2019-12-16 DIAGNOSIS — F419 Anxiety disorder, unspecified: Secondary | ICD-10-CM | POA: Diagnosis not present

## 2019-12-16 DIAGNOSIS — Z87891 Personal history of nicotine dependence: Secondary | ICD-10-CM | POA: Diagnosis not present

## 2019-12-16 DIAGNOSIS — Z981 Arthrodesis status: Secondary | ICD-10-CM | POA: Diagnosis not present

## 2019-12-16 DIAGNOSIS — Z20822 Contact with and (suspected) exposure to covid-19: Secondary | ICD-10-CM | POA: Insufficient documentation

## 2019-12-16 DIAGNOSIS — M7732 Calcaneal spur, left foot: Secondary | ICD-10-CM | POA: Diagnosis not present

## 2019-12-16 DIAGNOSIS — Z90710 Acquired absence of both cervix and uterus: Secondary | ICD-10-CM | POA: Diagnosis not present

## 2019-12-16 DIAGNOSIS — M19072 Primary osteoarthritis, left ankle and foot: Secondary | ICD-10-CM | POA: Diagnosis not present

## 2019-12-17 LAB — SARS CORONAVIRUS 2 (TAT 6-24 HRS): SARS Coronavirus 2: NEGATIVE

## 2019-12-18 ENCOUNTER — Encounter (HOSPITAL_COMMUNITY)
Admission: RE | Disposition: A | Discharge status: Home / Self Care | Payer: Self-pay | Source: Home / Self Care | Attending: Podiatry

## 2019-12-18 ENCOUNTER — Encounter (HOSPITAL_COMMUNITY): Payer: Self-pay | Admitting: Podiatry

## 2019-12-18 ENCOUNTER — Ambulatory Visit (HOSPITAL_COMMUNITY): Payer: BC Managed Care – PPO

## 2019-12-18 ENCOUNTER — Ambulatory Visit (HOSPITAL_COMMUNITY)
Admission: RE | Admit: 2019-12-18 | Discharge: 2019-12-18 | Disposition: A | Discharge status: Home / Self Care | Payer: BC Managed Care – PPO | Attending: Podiatry | Admitting: Podiatry

## 2019-12-18 ENCOUNTER — Ambulatory Visit (HOSPITAL_COMMUNITY): Payer: BC Managed Care – PPO | Admitting: Anesthesiology

## 2019-12-18 DIAGNOSIS — F419 Anxiety disorder, unspecified: Secondary | ICD-10-CM | POA: Insufficient documentation

## 2019-12-18 DIAGNOSIS — Z833 Family history of diabetes mellitus: Secondary | ICD-10-CM | POA: Diagnosis not present

## 2019-12-18 DIAGNOSIS — M7732 Calcaneal spur, left foot: Secondary | ICD-10-CM | POA: Diagnosis not present

## 2019-12-18 DIAGNOSIS — M1990 Unspecified osteoarthritis, unspecified site: Secondary | ICD-10-CM | POA: Diagnosis not present

## 2019-12-18 DIAGNOSIS — M79672 Pain in left foot: Secondary | ICD-10-CM | POA: Insufficient documentation

## 2019-12-18 DIAGNOSIS — F32A Depression, unspecified: Secondary | ICD-10-CM | POA: Insufficient documentation

## 2019-12-18 DIAGNOSIS — Z9889 Other specified postprocedural states: Secondary | ICD-10-CM

## 2019-12-18 DIAGNOSIS — Q7022 Fused toes, left foot: Secondary | ICD-10-CM

## 2019-12-18 DIAGNOSIS — Z87891 Personal history of nicotine dependence: Secondary | ICD-10-CM | POA: Insufficient documentation

## 2019-12-18 DIAGNOSIS — M205X2 Other deformities of toe(s) (acquired), left foot: Secondary | ICD-10-CM | POA: Insufficient documentation

## 2019-12-18 DIAGNOSIS — Z981 Arthrodesis status: Secondary | ICD-10-CM | POA: Diagnosis not present

## 2019-12-18 DIAGNOSIS — Z90710 Acquired absence of both cervix and uterus: Secondary | ICD-10-CM | POA: Insufficient documentation

## 2019-12-18 HISTORY — PX: CHILECTOMY: SHX6287

## 2019-12-18 SURGERY — CHILECTOMY
Anesthesia: General | Site: Foot | Laterality: Left

## 2019-12-18 MED ORDER — LACTATED RINGERS IV SOLN: INTRAVENOUS | Status: DC | PRN

## 2019-12-18 MED ORDER — MIDAZOLAM HCL 2 MG/2ML IJ SOLN
INTRAMUSCULAR | Status: AC
  Filled 2019-12-18: qty 2

## 2019-12-18 MED ORDER — FENTANYL CITRATE (PF) 100 MCG/2ML IJ SOLN
INTRAMUSCULAR | Status: DC | PRN
Start: 2019-12-18 — End: 2019-12-18
  Administered 2019-12-18 (×2): 25 ug via INTRAVENOUS
  Administered 2019-12-18: 50 ug via INTRAVENOUS

## 2019-12-18 MED ORDER — CEFAZOLIN SODIUM-DEXTROSE 2-4 GM/100ML-% IV SOLN
INTRAVENOUS | Status: AC
  Filled 2019-12-18: qty 100

## 2019-12-18 MED ORDER — PROMETHAZINE HCL 25 MG/ML IJ SOLN: 6.2500 mg | INTRAMUSCULAR | Status: DC | PRN

## 2019-12-18 MED ORDER — PROPOFOL 10 MG/ML IV BOLUS
INTRAVENOUS | Status: AC
  Filled 2019-12-18: qty 40

## 2019-12-18 MED ORDER — PROPOFOL 10 MG/ML IV BOLUS
INTRAVENOUS | Status: AC
  Filled 2019-12-18: qty 20

## 2019-12-18 MED ORDER — CHLORHEXIDINE GLUCONATE 0.12 % MT SOLN
15.0000 mL | Freq: Once | OROMUCOSAL | Status: AC
  Administered 2019-12-18: 15 mL via OROMUCOSAL

## 2019-12-18 MED ORDER — LIDOCAINE 2% (20 MG/ML) 5 ML SYRINGE
INTRAMUSCULAR | Status: AC
  Filled 2019-12-18: qty 5

## 2019-12-18 MED ORDER — BUPIVACAINE HCL (PF) 0.5 % IJ SOLN
INTRAMUSCULAR | Status: AC
  Filled 2019-12-18: qty 30

## 2019-12-18 MED ORDER — ONDANSETRON HCL 4 MG/2ML IJ SOLN
INTRAMUSCULAR | Status: DC | PRN
  Administered 2019-12-18: 4 mg via INTRAVENOUS

## 2019-12-18 MED ORDER — BUPIVACAINE HCL (PF) 0.5 % IJ SOLN
INTRAMUSCULAR | Status: DC | PRN
  Administered 2019-12-18: 16 mL

## 2019-12-18 MED ORDER — PROPOFOL 500 MG/50ML IV EMUL
INTRAVENOUS | Status: DC | PRN
  Administered 2019-12-18: 50 ug/kg/min via INTRAVENOUS

## 2019-12-18 MED ORDER — 0.9 % SODIUM CHLORIDE (POUR BTL) OPTIME
TOPICAL | Status: DC | PRN
  Administered 2019-12-18: 1000 mL

## 2019-12-18 MED ORDER — MEPERIDINE HCL 50 MG/ML IJ SOLN: 6.2500 mg | INTRAMUSCULAR | Status: DC | PRN

## 2019-12-18 MED ORDER — MIDAZOLAM HCL 5 MG/5ML IJ SOLN
INTRAMUSCULAR | Status: DC | PRN
  Administered 2019-12-18: 2 mg via INTRAVENOUS
  Administered 2019-12-18: 1 mg via INTRAVENOUS

## 2019-12-18 MED ORDER — BUPIVACAINE LIPOSOME 1.3 % IJ SUSP
INTRAMUSCULAR | Status: DC | PRN
  Administered 2019-12-18: 10 mL

## 2019-12-18 MED ORDER — PROPOFOL 10 MG/ML IV BOLUS
INTRAVENOUS | Status: DC | PRN
  Administered 2019-12-18 (×3): 10 mg via INTRAVENOUS
  Administered 2019-12-18: 50 mg via INTRAVENOUS

## 2019-12-18 MED ORDER — FENTANYL CITRATE (PF) 100 MCG/2ML IJ SOLN
INTRAMUSCULAR | Status: AC
  Filled 2019-12-18: qty 2

## 2019-12-18 MED ORDER — ONDANSETRON HCL 4 MG/2ML IJ SOLN
INTRAMUSCULAR | Status: AC
  Filled 2019-12-18: qty 2

## 2019-12-18 MED ORDER — ORAL CARE MOUTH RINSE: 15.0000 mL | Freq: Once | OROMUCOSAL | Status: AC

## 2019-12-18 MED ORDER — CHLORHEXIDINE GLUCONATE CLOTH 2 % EX PADS: 6.0000 | MEDICATED_PAD | Freq: Once | CUTANEOUS | Status: DC

## 2019-12-18 MED ORDER — BUPIVACAINE LIPOSOME 1.3 % IJ SUSP
INTRAMUSCULAR | Status: AC
  Filled 2019-12-18: qty 10

## 2019-12-18 MED ORDER — LIDOCAINE HCL (CARDIAC) PF 100 MG/5ML IV SOSY
PREFILLED_SYRINGE | INTRAVENOUS | Status: DC | PRN
  Administered 2019-12-18: 40 mg via INTRAVENOUS

## 2019-12-18 MED ORDER — LIDOCAINE HCL (PF) 1 % IJ SOLN
INTRAMUSCULAR | Status: AC
  Filled 2019-12-18: qty 30

## 2019-12-18 MED ORDER — LACTATED RINGERS IV SOLN
Freq: Once | INTRAVENOUS | Status: AC
  Administered 2019-12-18: 1000 mL via INTRAVENOUS

## 2019-12-18 MED ORDER — HYDROMORPHONE HCL 1 MG/ML IJ SOLN: 0.2500 mg | INTRAMUSCULAR | Status: DC | PRN

## 2019-12-18 MED ORDER — CEFAZOLIN SODIUM-DEXTROSE 2-4 GM/100ML-% IV SOLN
2.0000 g | INTRAVENOUS | Status: AC
  Administered 2019-12-18: 2 g via INTRAVENOUS

## 2019-12-18 SURGICAL SUPPLY — 55 items
APL PRP STRL LF DISP 70% ISPRP (MISCELLANEOUS) ×1
APL SKNCLS STERI-STRIP NONHPOA (GAUZE/BANDAGES/DRESSINGS) ×1
BANDAGE ELASTIC 3 LF NS (GAUZE/BANDAGES/DRESSINGS) ×1 IMPLANT
BANDAGE ESMARK 4X12 BL STRL LF (DISPOSABLE) ×1 IMPLANT
BENZOIN TINCTURE PRP APPL 2/3 (GAUZE/BANDAGES/DRESSINGS) ×2 IMPLANT
BIT DRILL 2.0MM (BIT) IMPLANT
BLADE AVERAGE 25X9 (BLADE) ×2 IMPLANT
BLADE SURG 15 STRL LF DISP TIS (BLADE) ×2 IMPLANT
BLADE SURG 15 STRL SS (BLADE) ×4
BNDG CMPR 12X4 ELC STRL LF (DISPOSABLE) ×1
BNDG CMPR MED 5X3 ELC HKLP NS (GAUZE/BANDAGES/DRESSINGS) ×1
BNDG CMPR STD VLCR NS LF 5.8X4 (GAUZE/BANDAGES/DRESSINGS) ×1
BNDG CONFORM 2 STRL LF (GAUZE/BANDAGES/DRESSINGS) ×2 IMPLANT
BNDG ELASTIC 4X5.8 VLCR NS LF (GAUZE/BANDAGES/DRESSINGS) ×2 IMPLANT
BNDG ESMARK 4X12 BLUE STRL LF (DISPOSABLE) ×2
BNDG GAUZE ELAST 4 BULKY (GAUZE/BANDAGES/DRESSINGS) ×2 IMPLANT
BOOT STEPPER DURA MED (SOFTGOODS) ×1 IMPLANT
CHLORAPREP W/TINT 26 (MISCELLANEOUS) ×2 IMPLANT
CLOTH BEACON ORANGE TIMEOUT ST (SAFETY) ×2 IMPLANT
COVER LIGHT HANDLE STERIS (MISCELLANEOUS) ×4 IMPLANT
COVER WAND RF STERILE (DRAPES) ×2 IMPLANT
CUFF TOURN SGL QUICK 18X4 (TOURNIQUET CUFF) ×1 IMPLANT
DECANTER SPIKE VIAL GLASS SM (MISCELLANEOUS) ×2 IMPLANT
DRAPE C-ARM 42X72 X-RAY (DRAPES) ×1 IMPLANT
DRILL BIT 2.0MM (BIT) ×2
DRSG ADAPTIC 3X8 NADH LF (GAUZE/BANDAGES/DRESSINGS) ×2 IMPLANT
ELECT REM PT RETURN 9FT ADLT (ELECTROSURGICAL) ×2
ELECTRODE REM PT RTRN 9FT ADLT (ELECTROSURGICAL) ×1 IMPLANT
GAUZE SPONGE 4X4 12PLY STRL (GAUZE/BANDAGES/DRESSINGS) ×2 IMPLANT
GLOVE BIO SURGEON STRL SZ7.5 (GLOVE) ×2 IMPLANT
GLOVE BIOGEL PI IND STRL 7.0 (GLOVE) ×2 IMPLANT
GLOVE BIOGEL PI INDICATOR 7.0 (GLOVE) ×2
GLOVE ECLIPSE 6.5 STRL STRAW (GLOVE) ×2 IMPLANT
GOWN STRL REUS W/TWL LRG LVL3 (GOWN DISPOSABLE) ×4 IMPLANT
K-WIRE 1.6X150 (WIRE) ×2
K-WIRE FX150X1.6XSMTH TROC (WIRE) ×1
KWIRE FX150X1.6XSMTH TROC (WIRE) IMPLANT
MANIFOLD NEPTUNE II (INSTRUMENTS) ×2 IMPLANT
NDL HYPO 27GX1-1/4 (NEEDLE) ×3 IMPLANT
NEEDLE HYPO 27GX1-1/4 (NEEDLE) ×6 IMPLANT
NS IRRIG 1000ML POUR BTL (IV SOLUTION) ×2 IMPLANT
PACK BASIC LIMB (CUSTOM PROCEDURE TRAY) ×1 IMPLANT
PAD ARMBOARD 7.5X6 YLW CONV (MISCELLANEOUS) ×2 IMPLANT
PLATE CP LT 0D (Plate) ×1 IMPLANT
RASP SM TEAR CROSS CUT (RASP) ×1 IMPLANT
SCREW LOCK 3.0X14MM (Screw) ×3 IMPLANT
SCREW LOCK 3X16MM (Screw) ×1 IMPLANT
SCREW NON LOCK 3X22MM (Screw) ×1 IMPLANT
SET BASIN LINEN APH (SET/KITS/TRAYS/PACK) ×2 IMPLANT
SPONGE LAP 18X18 RF (DISPOSABLE) ×2 IMPLANT
STRIP CLOSURE SKIN 1/2X4 (GAUZE/BANDAGES/DRESSINGS) ×1 IMPLANT
SUT PROLENE 4 0 PS 2 18 (SUTURE) ×2 IMPLANT
SUT VIC AB 4-0 PS2 27 (SUTURE) ×3 IMPLANT
SYR CONTROL 10ML LL (SYRINGE) ×4 IMPLANT
WIRE G 1.2X70 (WIRE) ×2 IMPLANT

## 2019-12-18 NOTE — Discharge Instructions (Signed)
[facility] UNTIL Sunday December 22, 2019. DO NOT USE ANY ADDITIONAL NUMBING MEDICATIONS WITHOUT CONSULTING A PHYSICIAN UNTIL AFTER December 22, 2019.     These instructions will give you an idea of what to expect after surgery and how to manage issues that may arise before your first post op office visit.  Pain Management Pain is best managed by "staying ahead" of it. If pain gets out of control, it is difficult to get it back under control. Local anesthesia that lasts 6-8 hours is used to numb the foot and decrease pain.  For the best pain control, take the pain medication every 4 hours for the first 2 days post op. On the third day pain medication can be taken as needed.   Post Op Nausea Nausea is common after surgery, so it is managed proactively.  If prescribed, use the prescribed nausea medication regularly for the first 2 days post op.  Bandages Do not worry if there is blood on the bandage. What looks like a lot of blood on the bandage is actually a small amount. Blood on the dressing spreads out as it is absorbed by the gauze, the same way a drop of water spreads out on a paper towel.  If the bandages feel wet or dry, stiff and uncomfortable, call the office during office hours and we will schedule a time for you to have the bandage changed.  Unless you are specifically told otherwise, we will do the first bandage change in the office.  Keep your bandage dry. If the bandage becomes wet or soiled, notify the office and we will schedule a time to change the bandage.  Activity It is best to spend most of the first 2 days after surgery lying down with the foot elevated above the level of your heart. You may put weight on your heel while wearing the CAM Walker (black boot). You may only get up to go to the restroom.  Driving Do not drive until you are able to respond in an emergency (i.e. slam on the brakes). This usually occurs after the bone has  healed - 6 to 8 weeks.  Call the Office If you have a fever over 101F.  If you have increasing pain after the initial post op pain has settled down.  If you have increasing redness, swelling, or drainage.  If you have any questions or concerns.         Monitored Anesthesia Care, Care After These instructions provide you with information about caring for yourself after your procedure. Your health care provider may also give you more specific instructions. Your treatment has been planned according to current medical practices, but problems sometimes occur. Call your health care provider if you have any problems or questions after your procedure. What can I expect after the procedure? After your procedure, you may:  Feel sleepy for several hours.  Feel clumsy and have poor balance for several hours.  Feel forgetful about what happened after the procedure.  Have poor judgment for several hours.  Feel nauseous or vomit.  Have a sore throat if you had a breathing tube during the procedure. Follow these instructions at home: For at least 24 hours after the procedure:      Have a responsible adult stay with you. It is important to have someone help care for you until you are awake and alert.  Rest as needed.  Do not: ? Participate in activities in which  you could fall or become injured. ? Drive. ? Use heavy machinery. ? Drink alcohol. ? Take sleeping pills or medicines that cause drowsiness. ? Make important decisions or sign legal documents. ? Take care of children on your own. Eating and drinking  Follow the diet that is recommended by your health care provider.  If you vomit, drink water, juice, or soup when you can drink without vomiting.  Make sure you have little or no nausea before eating solid foods. General instructions  Take over-the-counter and prescription medicines only as told by your health care provider.  If you have sleep apnea, surgery and certain  medicines can increase your risk for breathing problems. Follow instructions from your health care provider about wearing your sleep device: ? Anytime you are sleeping, including during daytime naps. ? While taking prescription pain medicines, sleeping medicines, or medicines that make you drowsy.  If you smoke, do not smoke without supervision.  Keep all follow-up visits as told by your health care provider. This is important. Contact a health care provider if:  You keep feeling nauseous or you keep vomiting.  You feel light-headed.  You develop a rash.  You have a fever. Get help right away if:  You have trouble breathing. Summary  For several hours after your procedure, you may feel sleepy and have poor judgment.  Have a responsible adult stay with you for at least 24 hours or until you are awake and alert. This information is not intended to replace advice given to you by your health care provider. Make sure you discuss any questions you have with your health care provider. Document Revised: 05/22/2017 Document Reviewed: 06/14/2015 Elsevier Patient Education  2020 [employer].

## 2019-12-18 NOTE — H&P (Signed)
HISTORY AND PHYSICAL INTERVAL NOTE:  12/18/2019  8:35 AM  Ashley [name]  has presented today for surgery, with the diagnosis of hallux limitus of left foot.  The various methods of treatment have been discussed with the patient.  No guarantees were given.  After consideration of risks, benefits and other options for treatment, the patient has consented to surgery.  I have reviewed the patients' chart and labs.    Patient Vitals for the past 24 hrs:  BP Temp Temp src Resp SpO2  12/18/19 0756 116/85 97.8 F (36.6 C) Oral (!) 21 95 %    A history and physical examination was performed in my office.  The patient was reexamined.  There have been no changes to this history and physical examination.  Dallas Schimke, DPM

## 2019-12-18 NOTE — Brief Op Note (Signed)
BRIEF OPERATIVE NOTE  DATE OF PROCEDURE 12/18/2019  SURGEON Dallas Schimke, DPM  ASSISTANT SURGEON Delton See, DPM  OR STAFF Circulator: Cox, Wyatt Haste, RN Radiology Technologist: Rich Number Scrub Person: Irving Burton, Marzella Schlein, CST Vendor Representative : Michaelene Song   PREOPERATIVE DIAGNOSIS 1.  Hallux limitus, left foot 2.  Pain, left foot  POSTOPERATIVE DIAGNOSIS Same  PROCEDURE Arthrodesis of the 1st metatarsophalangeal joint, left foot  ANESTHESIA Monitor Anesthesia Care   HEMOSTASIS Pneumatic ankle tourniquet set at 250 mmHg  ESTIMATED BLOOD LOSS Minimal (<5 cc)  MATERIALS USED St[employer]ker Anchorage CP Plate  INJECTABLES 0.5% Marcaine plain Exparel  PATHOLOGY None  COMPLICATIONS None

## 2019-12-18 NOTE — Op Note (Signed)
OPERATIVE NOTE  DATE OF PROCEDURE 12/18/2019  SURGEON Dallas Schimke, DPM  ASSISTANT SURGEON Delton See, DPM  OR STAFF Circulator: Cox, Wyatt Haste, RN Radiology Technologist: Rich Number Scrub Person: Irving Burton, Marzella Schlein, CST Vendor Representative : Michaelene Song   PREOPERATIVE DIAGNOSIS 1.  Hallux limitus, left foot 2.  Pain, left foot  POSTOPERATIVE DIAGNOSIS Same  PROCEDURE Arthrodesis of the 1st metatarsophalangeal joint, left foot  ANESTHESIA Monitor Anesthesia Care   HEMOSTASIS Pneumatic ankle tourniquet set at 250 mmHg  ESTIMATED BLOOD LOSS Minimal (<5 cc)  MATERIALS USED St[employer]ker Anchorage CP Plate  INJECTABLES 0.5% Marcaine plain Exparel  PATHOLOGY None  COMPLICATIONS None  DESCRIPTION OF THE PROCEDURE:  The patient was brought to the operating room and placed on the operative table in the supine position.  A pneumatic ankle tourniquet was placed about the patient's left ankle.  A timeout was performed.  The left foot was anesthetized with 0.5% Marcaine plain.  The foot was scrubbed, prepped and draped in the usual sterile manner.  A second timeout was performed.  The limb was then elevated, exsanguinated and the pneumatic ankle tourniquet was inflated to 250 mmHg.  Attention was directed to the dorsal medial aspect of the left foot.  A linear longitudinal incision was made medial and parallel to the extensor hallucis longus tendon.  Dissection was continued deep down to the level of the first metatarsophalangeal joint.  Vital structures were identified and retracted.  A linear longitudinal periosteal and capsular incision was performed.  The periosteal and capsular structures were reflected medially and laterally thus exposing the first metatarsal phalangeal joint at the operative site.  Dorsal spurring of the first metatarsal head was noted.  There were erosive changes to the lateral 40% of the articular surface of the first  metatarsal head.  The articular cartilage was removed from the head of the first metatarsal and base of the proximal phalanx using cone and cup reamers.  The subchondral bone was fenestrated using a K wire.  The surgical wound was irrigated with copious amounts of sterile irrigant.  The first metatarsal phalangeal joint was fixated using a [employer]ker Anchorage CP plate.  Adequate compression was achieved.  Position of the plate and screws were confirmed with C arm fluoroscopy.  The surgical wound was irrigated with copious amounts of sterile irrigant.  The periosteal and capsular structures were reapproximated using 4-0 Vicryl.  The subcutaneous structures were reapproximated using 4-0 Vicryl.  The skin was closed using 4-0 Prolene.  The wound closure was reinforced with Steri-Strips.  10 cc of Exparel was injected in a Mayo block.  A sterile compressive dressing was applied to the left foot.  The pneumatic ankle tourniquet was deflated and a prompt hyperemic response was noted to all digits of the left foot.  The patient tolerated the procedure and anesthesia well.  She was transferred from the operating room to the postanesthesia care unit with vital signs stable.

## 2019-12-18 NOTE — Anesthesia Preprocedure Evaluation (Signed)
Anesthesia Evaluation  Patient identified by MRN, date of birth, ID band Patient awake    Reviewed: Allergy & Precautions, NPO status , Patient's Chart, lab work & pertinent test results  History of Anesthesia Complications Negative for: history of anesthetic complications  Airway        Dental   Pulmonary former smoker,    Pulmonary exam normal breath sounds clear to auscultation       Cardiovascular Exercise Tolerance: Good Normal cardiovascular exam Rhythm:Regular Rate:Normal     Neuro/Psych PSYCHIATRIC DISORDERS Anxiety Depression  Neuromuscular disease    GI/Hepatic negative GI ROS, Neg liver ROS,   Endo/Other  negative endocrine ROS  Renal/GU negative Renal ROS     Musculoskeletal negative musculoskeletal ROS (+)   Abdominal   Peds  Hematology negative hematology ROS (+)   Anesthesia Other Findings   Reproductive/Obstetrics negative OB ROS                             Anesthesia Physical Anesthesia Plan  ASA: II  Anesthesia Plan: General   Post-op Pain Management:    Induction: Intravenous  PONV Risk Score and Plan: TIVA  Airway Management Planned: Nasal Cannula, Natural Airway and Simple Face Mask  Additional Equipment:   Intra-op Plan:   Post-operative Plan:   Informed Consent: I have reviewed the patients History and Physical, chart, labs and discussed the procedure including the risks, benefits and alternatives for the proposed anesthesia with the patient or authorized representative who has indicated his/her understanding and acceptance.     Dental advisory given  Plan Discussed with: CRNA and Surgeon  Anesthesia Plan Comments:        Anesthesia Quick Evaluation

## 2019-12-18 NOTE — Transfer of Care (Signed)
Immediate Anesthesia Transfer of Care Note  Patient: Ashley [name]  Procedure(s) Performed: Fusion of Left Great Toe Joint (Left Foot)  Patient Location: PACU  Anesthesia Type:MAC  Level of Consciousness: awake, alert  and oriented  Airway & Oxygen Therapy: Patient Spontanous Breathing  Post-op Assessment: Report given to RN and Post -op Vital signs reviewed and stable  Post vital signs: Reviewed and stable  Last Vitals:  Vitals Value Taken Time  BP    Temp    Pulse 82 12/18/19 1033  Resp 16 12/18/19 1033  SpO2 94 % 12/18/19 1033  Vitals shown include unvalidated device data.  Last Pain:  Vitals:   12/18/19 0756  TempSrc: Oral  PainSc: 8       Patients Stated Pain Goal: 9 (02/54/27 0623)  Complications: No complications documented.

## 2019-12-18 NOTE — Anesthesia Postprocedure Evaluation (Signed)
Anesthesia Post Note  Patient: Ashley [name]  Procedure(s) Performed: Fusion of Left Great Toe Joint (Left Foot)  Patient location during evaluation: PACU Anesthesia Type: General Level of consciousness: awake and alert and oriented Pain management: pain level controlled Vital Signs Assessment: post-procedure vital signs reviewed and stable Respiratory status: spontaneous breathing and respiratory function stable Cardiovascular status: stable and blood pressure returned to baseline Postop Assessment: no apparent nausea or vomiting Anesthetic complications: no   No complications documented.   Last Vitals:  Vitals:   12/18/19 1100 12/18/19 1115  BP: 101/67 111/81  Pulse: 77 78  Resp: 16 17  Temp:  36.5 C  SpO2: 96% 98%    Last Pain:  Vitals:   12/18/19 1115  TempSrc: Oral  PainSc: 0-No pain                 Terilyn Sano C Bonetta Mostek

## 2019-12-19 ENCOUNTER — Encounter (HOSPITAL_COMMUNITY): Payer: Self-pay | Admitting: Podiatry

## 2020-01-01 ENCOUNTER — Encounter: Payer: Self-pay | Admitting: Family Medicine

## 2020-01-03 DIAGNOSIS — Z4889 Encounter for other specified surgical aftercare: Secondary | ICD-10-CM | POA: Diagnosis not present

## 2020-01-07 ENCOUNTER — Encounter: Payer: Self-pay | Admitting: Family Medicine

## 2020-01-07 ENCOUNTER — Other Ambulatory Visit: Payer: Self-pay

## 2020-01-07 ENCOUNTER — Telehealth: Payer: BC Managed Care – PPO | Admitting: Family Medicine

## 2020-01-09 ENCOUNTER — Other Ambulatory Visit: Payer: Self-pay | Admitting: Family Medicine

## 2020-01-10 ENCOUNTER — Other Ambulatory Visit: Payer: Self-pay

## 2020-01-10 ENCOUNTER — Ambulatory Visit: Payer: BC Managed Care – PPO | Admitting: Family Medicine

## 2020-01-10 ENCOUNTER — Encounter: Payer: Self-pay | Admitting: Family Medicine

## 2020-01-10 VITALS — BP 122/66 | HR 96 | Temp 97.9°F | Resp 14 | Ht 62.0 in | Wt 173.9 lb

## 2020-01-10 DIAGNOSIS — F321 Major depressive disorder, single episode, moderate: Secondary | ICD-10-CM

## 2020-01-10 DIAGNOSIS — F411 Generalized anxiety disorder: Secondary | ICD-10-CM | POA: Diagnosis not present

## 2020-01-10 MED ORDER — BUSPIRONE HCL 5 MG PO TABS: 5.0000 mg | ORAL_TABLET | Freq: Two times a day (BID) | ORAL | 1 refills | Status: DC

## 2020-01-10 MED ORDER — ALPRAZOLAM 0.5 MG PO TABS: 0.5000 mg | ORAL_TABLET | Freq: Three times a day (TID) | ORAL | 0 refills | Status: DC | PRN

## 2020-01-10 NOTE — Progress Notes (Signed)
   Subjective:    Patient ID: Ashley [name], female    DOB: 09-Aug-1965, 54 y.o.   MRN: [id number]  Patient presents for Medication Management   Pt to here to discuss anxiety medication.  She has sent a MyChart message stating that she had tapered herself off of Cymbalta.  She did however stated that her anxiety was worsening and she [name] like she is having panic attacks and the lorazepam was not working she was taking this 3 times a day.  Today she states that she wants to come off the Cymbalta because she just [name] numb to everything.  She [name] like she did not have any emotions at all and did not like this feeling.  She states that she knows that she has underlying panic  attack for many years but she [name] like her depression was improved.  She recently celebrated Halloween which was her brothers favorite holiday  she states that she [name] that she did cry but she also [name] happy remembering them.  She does however feel like she needs something to help control her anxiety  She is still on medical leave for her foot surgery  Review Of Systems:  GEN- denies fatigue, fever, weight loss,weakness, recent illness HEENT- denies eye drainage, change in vision, nasal discharge, CVS- denies chest pain, palpitations RESP- denies SOB, cough, wheeze ABD- denies N/V, change in stools, abd pain GU- denies dysuria, hematuria, dribbling, incontinence MSK- denies joint pain, muscle aches, injury Neuro- denies headache, dizziness, syncope, seizure activity       Objective:    BP 122/66   Pulse 96   Temp 97.9 F (36.6 C) (Temporal)   Resp 14   Ht 5\' 2"  (1.575 m)   Wt 173 lb 14.4 oz (78.9 kg)   SpO2 100%   BMI 31.81 kg/m  GEN- NAD, alert and oriented x3 Psych normal affect and mood, not anxious, not depressed, good eye contact, well groomed        Assessment & Plan:      Problem List Items Addressed This Visit      Unprioritized   GAD (generalized anxiety disorder) - Primary    She has been  on multiple SSRI/SNRI Will try buspar 5mg  BID and change to xanax 0.5mg  BID prn  F/u 1 month       Relevant Medications   ALPRAZolam (XANAX) 0.5 MG tablet   busPIRone (BUSPAR) 5 MG tablet   MDD (major depressive disorder)   Relevant Medications   ALPRAZolam (XANAX) 0.5 MG tablet   busPIRone (BUSPAR) 5 MG tablet      Note: This dictation was prepared with Dragon dictation along with smaller phrase technology. Any transcriptional errors that result from this process are unintentional.

## 2020-01-10 NOTE — Patient Instructions (Signed)
F/U as previous Start buspar twice a day Xanax as needed

## 2020-01-10 NOTE — Assessment & Plan Note (Signed)
She has been on multiple SSRI/SNRI Will try buspar 5mg  BID and change to xanax 0.5mg  BID prn  F/u 1 month

## 2020-01-21 DIAGNOSIS — Z4889 Encounter for other specified surgical aftercare: Secondary | ICD-10-CM | POA: Diagnosis not present

## 2020-02-04 DIAGNOSIS — Z4889 Encounter for other specified surgical aftercare: Secondary | ICD-10-CM | POA: Diagnosis not present

## 2020-02-05 ENCOUNTER — Encounter: Payer: Self-pay | Admitting: Family Medicine

## 2020-02-05 ENCOUNTER — Ambulatory Visit: Payer: BC Managed Care – PPO | Admitting: Family Medicine

## 2020-02-05 ENCOUNTER — Other Ambulatory Visit: Payer: Self-pay

## 2020-02-05 VITALS — BP 124/80 | HR 88 | Temp 98.4°F | Resp 14 | Ht 62.0 in | Wt 176.0 lb

## 2020-02-05 DIAGNOSIS — F321 Major depressive disorder, single episode, moderate: Secondary | ICD-10-CM

## 2020-02-05 DIAGNOSIS — F411 Generalized anxiety disorder: Secondary | ICD-10-CM | POA: Diagnosis not present

## 2020-02-05 MED ORDER — ALPRAZOLAM 0.5 MG PO TABS: 0.5000 mg | ORAL_TABLET | Freq: Three times a day (TID) | ORAL | 2 refills | Status: DC | PRN

## 2020-02-05 MED ORDER — BUSPIRONE HCL 7.5 MG PO TABS
7.5000 mg | ORAL_TABLET | Freq: Two times a day (BID) | ORAL | 3 refills | Status: DC
Start: 2020-02-05 — End: 2020-02-10

## 2020-02-05 MED ORDER — BUSPIRONE HCL 7.5 MG PO TABS
7.5000 mg | ORAL_TABLET | Freq: Two times a day (BID) | ORAL | 1 refills | Status: DC
Start: 2020-02-05 — End: 2020-02-05

## 2020-02-05 NOTE — Progress Notes (Signed)
   Subjective:    Patient ID: Ashley [name], female    DOB: 1965-07-13, 54 y.o.   MRN: [id number]  Patient presents for Follow-up (is fasting)  Patient here for follow-up medications.  She was seen about 1 month ago to discuss her anxiety and depression medications.  She had weaned herself off of Cymbalta.  But she is still having difficulty with her anxiety.  He was started on BuSpar 5 mg twice a day benzodiazepine change to alprazolam 0.5 mg twice a day as needed.  She has been on multiple SSRIs/SNRI in the past. Today she states she doesn't feel as sad and has less anxiety attacks but has only [name] the effects of the meds the past few days, not sure if it needs to be adjusted Sleep is better She did have some tingling  In fingertips right after taking med but it goes away fairly quickly  She is still in post op shoe for another 2 weeks, as appt with podiatry   Review Of Systems:  GEN- denies fatigue, fever, weight loss,weakness, recent illness HEENT- denies eye drainage, change in vision, nasal discharge, CVS- denies chest pain, palpitations RESP- denies SOB, cough, wheeze ABD- denies N/V, change in stools, abd pain GU- denies dysuria, hematuria, dribbling, incontinence MSK- denies joint pain, muscle aches, injury Neuro- denies headache, dizziness, syncope, seizure activity       Objective:    BP 124/80   Pulse 88   Temp 98.4 F (36.9 C) (Temporal)   Resp 14   Ht 5\' 2"  (1.575 m)   Wt 176 lb (79.8 kg)   SpO2 97%   BMI 32.19 kg/m  GEN- NAD, alert and oriented x3 Neck- Supple, no thyromegaly CVS- RRR, no murmur RESP-CTAB Psych- normal affect and mood, normal speech   PHQ  Score 7   / GAD 14     Assessment & Plan:      Problem List Items Addressed This Visit      Unprioritized   GAD (generalized anxiety disorder)    Some improvement in overall symptoms Still using xanax BID Increase buspar to 7.5mg  BID, she is trying to reduce benzo use F/U 6 weeks  Call for any  concerns       Relevant Medications   ALPRAZolam (XANAX) 0.5 MG tablet   MDD (major depressive disorder) - Primary   Relevant Medications   ALPRAZolam (XANAX) 0.5 MG tablet      Note: This dictation was prepared with Dragon dictation along with smaller phrase technology. Any transcriptional errors that result from this process are unintentional.

## 2020-02-05 NOTE — Assessment & Plan Note (Signed)
Some improvement in overall symptoms Still using xanax BID Increase buspar to 7.5mg  BID, she is trying to reduce benzo use F/U 6 weeks  Call for any concerns

## 2020-02-05 NOTE — Patient Instructions (Addendum)
F/U 6 weeks  Increase to  7.5mg  of buspar and continue xanax

## 2020-02-07 ENCOUNTER — Other Ambulatory Visit: Payer: Self-pay | Admitting: Family Medicine

## 2020-02-07 ENCOUNTER — Telehealth: Payer: Self-pay | Admitting: *Deleted

## 2020-02-07 ENCOUNTER — Encounter: Payer: Self-pay | Admitting: Family Medicine

## 2020-02-07 NOTE — Telephone Encounter (Signed)
Received request from pharmacy for PA on Buspirone.  PA submitted.   Dx: F41.1- GAD.   Your information has been submitted to [facility]. [employer] [place] will review the request and notify you of the determination decision directly, typically within 72 hours of receiving all information.  You will also receive your request decision electronically. To check for an update later, open this request again from your dashboard.  If [employer] has not responded within the specified timeframe or if you have any questions about your PA submission, contact [employer]  directly at [phone].

## 2020-02-10 ENCOUNTER — Encounter: Payer: Self-pay | Admitting: Family Medicine

## 2020-02-10 MED ORDER — BUSPIRONE HCL 5 MG PO TABS
5.0000 mg | ORAL_TABLET | Freq: Three times a day (TID) | ORAL | 3 refills | Status: DC
Start: 2020-02-10 — End: 2020-04-15

## 2020-02-10 NOTE — Telephone Encounter (Signed)
Received PA determination.   PA denied.   Medication changed per PCP.

## 2020-02-10 NOTE — Telephone Encounter (Signed)
Medication changed

## 2020-02-18 DIAGNOSIS — Z4889 Encounter for other specified surgical aftercare: Secondary | ICD-10-CM | POA: Diagnosis not present

## 2020-03-18 ENCOUNTER — Ambulatory Visit: Payer: BC Managed Care – PPO | Admitting: Family Medicine

## 2020-03-27 ENCOUNTER — Ambulatory Visit: Payer: BC Managed Care – PPO | Admitting: Family Medicine

## 2020-04-15 ENCOUNTER — Other Ambulatory Visit: Payer: Self-pay

## 2020-04-15 ENCOUNTER — Encounter: Payer: Self-pay | Admitting: Family Medicine

## 2020-04-15 ENCOUNTER — Ambulatory Visit: Payer: BC Managed Care – PPO | Admitting: Family Medicine

## 2020-04-15 VITALS — BP 120/64 | HR 78 | Temp 97.9°F | Resp 14 | Ht 62.0 in | Wt 171.0 lb

## 2020-04-15 DIAGNOSIS — F411 Generalized anxiety disorder: Secondary | ICD-10-CM

## 2020-04-15 MED ORDER — ALPRAZOLAM 0.5 MG PO TABS: 0.5000 mg | ORAL_TABLET | Freq: Three times a day (TID) | ORAL | 3 refills | Status: DC | PRN

## 2020-04-15 MED ORDER — BUSPIRONE HCL 5 MG PO TABS
5.0000 mg | ORAL_TABLET | Freq: Three times a day (TID) | ORAL | 5 refills | Status: DC
Start: 2020-04-15 — End: 2020-10-23

## 2020-04-15 NOTE — Patient Instructions (Signed)
F/U 6 months Physical with Ashley [name]

## 2020-04-15 NOTE — Progress Notes (Signed)
   Subjective:    Patient ID: Ashley [name], female    DOB: 04-Jul-1965, 55 y.o.   MRN: [id number]  Patient presents for Follow-up (Medication review- Buspar/ Xanax- feels anxiety is improved)  Here to follow-up medications for her anxiety and depression.  Currently on BuSpar as well as alprazolam as needed.  At her visit back in December she had weaned herself off of Cymbalta she could not feel her "emotions" however she did note that she is still having anxiety Her BuSpar was increased to 7.5 mg twice a day at that time.  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 and she feels good to be able to do something like this that would be high aniety provoking     She is now exercising regulary  Low carb diet, higher protein   weight down 5lbs       Review Of Systems:  GEN- denies fatigue, fever, weight loss,weakness, recent illness HEENT- denies eye drainage, change in vision, nasal discharge, CVS- denies chest pain, palpitations RESP- denies SOB, cough, wheeze ABD- denies N/V, change in stools, abd pain GU- denies dysuria, hematuria, dribbling, incontinence MSK- denies joint pain, muscle aches, injury Neuro- denies headache, dizziness, syncope, seizure activity       Objective:    BP 120/64   Pulse 78   Temp 97.9 F (36.6 C) (Temporal)   Resp 14   Ht 5\' 2"  (1.575 m)   Wt 171 lb (77.6 kg)   SpO2 99%   BMI 31.28 kg/m  GEN- NAD, alert and oriented x3 Psych normal affect and mood, smiling, well groomed         Assessment & Plan:      Problem List Items Addressed This Visit      Unprioritized   GAD (generalized anxiety disorder) - Primary    Mood much improved on current regimen Continue buspar 7.5mg  BID ( insurance will not cover 7.5mg  tabs), continue prn xanax Call for  any changes F/U 6 months for Physical       Relevant Medications   busPIRone (BUSPAR) 5 MG tablet   ALPRAZolam (XANAX) 0.5 MG tablet      Note: This dictation was prepared with Dragon dictation along with smaller phrase technology. Any transcriptional errors that result from this process are unintentional.

## 2020-04-15 NOTE — Assessment & Plan Note (Signed)
Mood much improved on current regimen Continue buspar 7.5mg  BID ( insurance will not cover 7.5mg  tabs), continue prn xanax Call for any changes F/U 6 months for Physical

## 2020-04-16 MED ORDER — ALPRAZOLAM 0.5 MG PO TABS: 0.5000 mg | ORAL_TABLET | Freq: Three times a day (TID) | ORAL | 1 refills | Status: DC | PRN

## 2020-04-24 DIAGNOSIS — Z4889 Encounter for other specified surgical aftercare: Secondary | ICD-10-CM | POA: Diagnosis not present

## 2020-07-06 ENCOUNTER — Other Ambulatory Visit: Payer: Self-pay | Admitting: Family Medicine

## 2020-08-07 DIAGNOSIS — M79672 Pain in left foot: Secondary | ICD-10-CM | POA: Diagnosis not present

## 2020-08-07 DIAGNOSIS — M9689 Other intraoperative and postprocedural complications and disorders of the musculoskeletal system: Secondary | ICD-10-CM | POA: Diagnosis not present

## 2020-08-19 DIAGNOSIS — M960 Pseudarthrosis after fusion or arthrodesis: Secondary | ICD-10-CM | POA: Diagnosis not present

## 2020-09-22 DIAGNOSIS — M9689 Other intraoperative and postprocedural complications and disorders of the musculoskeletal system: Secondary | ICD-10-CM | POA: Diagnosis not present

## 2020-09-29 ENCOUNTER — Encounter: Payer: Self-pay | Admitting: Nurse Practitioner

## 2020-09-30 ENCOUNTER — Other Ambulatory Visit (HOSPITAL_COMMUNITY): Payer: Self-pay | Admitting: Nurse Practitioner

## 2020-09-30 ENCOUNTER — Other Ambulatory Visit: Payer: Self-pay | Admitting: Nurse Practitioner

## 2020-09-30 DIAGNOSIS — Z1231 Encounter for screening mammogram for malignant neoplasm of breast: Secondary | ICD-10-CM

## 2020-09-30 MED ORDER — ALPRAZOLAM 0.5 MG PO TABS: ORAL_TABLET | ORAL | 0 refills | Status: DC

## 2020-09-30 NOTE — Telephone Encounter (Signed)
Refill given - please make the patient aware I am not comfortable prescribing alprazolam for use multiple times daily and we can either discuss other options moving forward (slowly discontinuing and maximizing other medication for mood) at her upcoming or we can refer to psychiatry.

## 2020-09-30 NOTE — Progress Notes (Signed)
PDMP reviewed.  Patient takes this medication chronically.  Will give refill until we can discuss this medication in office.

## 2020-10-22 NOTE — Progress Notes (Signed)
BP 122/74   Pulse 88   Temp 98 F (36.7 C) (Temporal)   Resp 16   Ht 5\' 2"  (1.575 m)   Wt 168 lb (76.2 kg)   SpO2 96%   BMI 30.73 kg/m    Subjective:    Patient ID: , female    DOB: October 22, 1965, 55 y.o.   MRN: [id number]  HPI: Ashley [name] is a 55 y.o. female presenting on 10/23/2020 for comprehensive medical examination. 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  Was on Buspar, had tingling in feet and fingers 3-4 hours after taking it and so she stopped taking it.  Has also tried other anxiety medications in the past including Cymbalta and Prozac.  Currently, she is taking alprazolam 0.5 mg three times daily.  She is interested in doing genetic testing to see what medication may be best for her. Duration: chronic Anxious mood: yes  Excessive worrying: yes Irritability: no  Sweating: yes Nausea: no Palpitations:no Hyperventilation: no Panic attacks: yes Agoraphobia: no  Obscessions/compulsions: no Depressed mood: no Depression screen [facility] 2/9 10/23/2020 04/15/2020 02/05/2020 10/22/2019 08/12/2019  Decreased Interest 0 0 1 1 1   Down, Depressed, Hopeless 0 0 1 1 3   PHQ - 2 Score 0 0 2 2 4   Altered sleeping 0 0 1 1 2   Tired, decreased energy 1 1 1 3 1   Change in appetite 0 0 1 0 0  Feeling bad or failure about yourself  0 0 1 0 0  Trouble concentrating 0 0 1 0 2  Moving slowly or fidgety/restless 0 0 0 0 2  Suicidal thoughts 0 0 0 0 0  PHQ-9 Score 1 1 7 6 11   Difficult doing work/chores Not difficult at all - Somewhat difficult Somewhat difficult Somewhat difficult  Some recent data might be hidden   Anhedonia: no Weight changes: no Insomnia: no ; does have a hard time sleeping during the day  Hypersomnia: no Fatigue/loss of energy: yes Feelings of worthlessness: no Feelings of guilt: no Impaired  concentration/indecisiveness: no Suicidal ideations: no  Crying spells: no Recent Stressors/Life Changes: yes   Relationship problems: no   Family stress: no     Financial stress: no    Job stress: yes    Recent death/loss: yes  GERD Currently taking omeprazole 20 mg daily.  She takes it every day because she forgets to take it before eating triggering foods. GERD control status: controlled Satisfied with current treatment? yes Heartburn frequency: only if she missed medication and eats trigger food Medication side effects: no  Medication compliance: excellent Dysphagia: no Odynophagia:  no Hematemesis: no Blood in stool: no EGD: no  LMP: Hysterectomy at age 65  Depression Screen done today and results listed below:  Depression screen [facility] 2/9 10/23/2020 04/15/2020 02/05/2020 10/22/2019 08/12/2019  Decreased Interest 0 0 1 1 1   Down, Depressed, Hopeless 0 0 1 1 3   PHQ - 2 Score 0 0 2 2 4   Altered sleeping 0 0 1 1 2   Tired, decreased energy 1 1 1 3 1   Change in appetite 0 0 1 0 0  Feeling bad or failure about yourself  0 0 1 0 0  Trouble concentrating 0 0 1 0 2  Moving slowly or fidgety/restless 0 0 0 0 2  Suicidal thoughts 0 0 0 0 0  PHQ-9  Score Difficult doing work/chores Not difficult at all - Somewhat difficult Somewhat difficult Somewhat difficult  Some recent data might be hidden   The patient does not have a history of falls. I did not complete a risk assessment for falls. A plan of care for falls was not documented.   Past Medical History:  Past Medical History:  Diagnosis Date   Anxiety    Hyperlipidemia     Surgical History:  Past Surgical History:  Procedure Laterality Date   ABDOMINAL HYSTERECTOMY     Endometriosis   CARPAL TUNNEL RELEASE  2005   bilateral   CHILECTOMY Left 12/18/2019   Procedure: Fusion of Left Great Toe Joint;  Surgeon: Ferman Hamming, DPM;  Location: [facility];  Service: Podiatry;  Laterality: Left;   COLONOSCOPY N/A  07/12/2017   Procedure: COLONOSCOPY;  Surgeon: Malissa Hippo, MD;  Location: [facility];  Service: Endoscopy;  Laterality: N/A;  830   FOOT ARTHRODESIS Right 07/12/2012   Procedure: ARTHRODESIS FOOT FIRST  METATARSOPHALANGEAL JOINT;  Surgeon: Dallas Schimke, DPM;  Location: [facility];  Service: Orthopedics;  Laterality: Right;    Medications:  Current Outpatient Medications on File Prior to Visit  Medication Sig   ALPRAZolam (XANAX) 0.5 MG tablet TAKE 1 TABLET(0.5 MG) BY MOUTH THREE TIMES DAILY AS NEEDED FOR ANXIETY   naproxen sodium (ALEVE) 220 MG tablet Take 220 mg by mouth 2 (two) times daily as needed (pain).   omeprazole (PRILOSEC OTC) 20 MG tablet Take 20 mg by mouth daily.   OVER THE COUNTER MEDICATION Take 6 tablets by mouth daily. Balanace of nature fruits and vegetables   No current facility-administered medications on file prior to visit.    Allergies:  Allergies  Allergen Reactions   Bee Venom Hives   Statins     Muscle aches    Social History:  Social History   Socioeconomic History   Marital status: Married    Spouse name: Not on file   Number of children: Not on file   Years of education: Not on file   Highest education level: Not on file  Occupational History   Not on file  Tobacco Use   Smoking status: Former   Smokeless tobacco: Never  Substance and Sexual Activity   Alcohol use: Yes   Drug use: No   Sexual activity: Yes  Other Topics Concern   Not on file  Social History Narrative   Not on file   Social Determinants of Health   Financial Resource Strain: Not on file  Food Insecurity: Not on file  Transportation Needs: Not on file  Physical Activity: Not on file  Stress: Not on file  Social Connections: Not on file  Intimate Partner Violence: Not on file   Social History   Tobacco Use  Smoking Status Former  Smokeless Tobacco Never   Social History   Substance and Sexual Activity  Alcohol Use Yes    Family History:   Family History  Problem Relation Age of Onset   Parkinson's disease Mother    Diabetes Father    Cancer Maternal Aunt    Cancer Maternal Uncle        colon cancer    Past medical history, surgical history, medications, allergies, family history and social history reviewed with patient today and changes made to appropriate areas of the chart.   Review of Systems  Constitutional: Negative.   HENT: Negative.    Eyes: Negative.  Respiratory: Negative.    Cardiovascular: Negative.   Gastrointestinal: Negative.   Genitourinary: Negative.   Musculoskeletal: Negative.   Skin: Negative.   Neurological: Negative.   Psychiatric/Behavioral:  The patient is nervous/anxious and has insomnia.       Objective:    BP 122/74   Pulse 88   Temp 98 F (36.7 C) (Temporal)   Resp 16   Ht 5\' 2"  (1.575 m)   Wt 168 lb (76.2 kg)   SpO2 96%   BMI 30.73 kg/m   Wt Readings from Last 3 Encounters:  10/23/20 168 lb (76.2 kg)  04/15/20 171 lb (77.6 kg)  02/05/20 176 lb (79.8 kg)    Physical Exam Vitals and nursing note reviewed.  Constitutional:      General: She is not in acute distress.    Appearance: Normal appearance. She is normal weight. She is not ill-appearing or toxic-appearing.  HENT:     Head: Normocephalic and atraumatic.     Right Ear: Tympanic membrane, ear canal and external ear normal.     Left Ear: Tympanic membrane, ear canal and external ear normal.     Nose: Nose normal. No congestion or rhinorrhea.     Mouth/Throat:     Mouth: Mucous membranes are moist.     Pharynx: Oropharynx is clear. No oropharyngeal exudate.  Eyes:     General: No scleral icterus.    Pupils: Pupils are equal, round, and reactive to light.  Neck:     Vascular: No carotid bruit.  Cardiovascular:     Rate and Rhythm: Normal rate and regular rhythm.     Pulses: Normal pulses.     Heart sounds: Normal heart sounds. No murmur heard. Pulmonary:     Effort: Pulmonary effort is normal. No  respiratory distress.     Breath sounds: No wheezing or rhonchi.  Chest:     Comments: Deferred - mammogram scheduled for today Abdominal:     General: Abdomen is flat. Bowel sounds are normal. There is no distension.     Palpations: Abdomen is soft.     Tenderness: There is no abdominal tenderness.  Musculoskeletal:        General: No swelling or tenderness. Normal range of motion.     Cervical back: Normal range of motion. No rigidity.     Right lower leg: No edema.     Left lower leg: No edema.  Skin:    General: Skin is warm and dry.     Capillary Refill: Capillary refill takes less than 2 seconds.     Coloration: Skin is not jaundiced or pale.  Neurological:     General: No focal deficit present.     Mental Status: She is alert and oriented to person, place, and time.     Motor: No weakness.     Gait: Gait normal.  Psychiatric:        Mood and Affect: Mood normal.        Behavior: Behavior normal.        Thought Content: Thought content normal.        Judgment: Judgment normal.      Assessment & Plan:   Problem List Items Addressed This Visit       Other   Hyperlipidemia    Chronic.  We will recheck lipids today- previous ASCVD risk has been low.  Patient is fasting today.  Follow-up pending blood work.       Relevant Orders   Lipid panel  Heartburn    Chronic.  Stable with daily omeprazole 20 mg daily.  No red flags in history or on examination today-we will check magnesium and CBC today.  Follow-up if omeprazole stops helping or if new symptoms develop.  Follow-up 6 months.      Relevant Orders   Magnesium   VITAMIN D 25 Hydroxy (Vit-D Deficiency, Fractures)   GAD (generalized anxiety disorder)    Chronic, uncontrolled.  Patient is weary to start another antianxiety medication as she has had poor reactions to a couple in the past.  She is requesting genetic testing-I will order today.  Discussed benzodiazepine use at length with her today.  Options include  referral to psychiatry versus daily antianxiety medicine like SSRIs/SNRI and cutting down on use of benzodiazepine.  I am not comfortable prescribing long-term benzodiazepines for multiple times use per day and she was made aware of this multiple times.  Pt is aware of risks of psychoactive medication use to include increased sedation, respiratory suppression, falls, extrapyramidal movements,  dependence and cardiovascular events.  Pt would like to continue treatment as benefit determined to outweigh risk.  We will await the results of the genetic testing- she is agreeable to start something that may be suitable for her after this blood work.  She declines referral to psychiatry.  PDMP reviewed- refill given for alprazolam 0.5 mg up to 3 times daily as needed for anxiety.  She is aware that this prescription should last until her next appointment.  She is also aware that I will not be prescribing this long-term for her.  Follow-up in 1 month.       Relevant Orders   CBC with Differential/Platelet   COMPLETE METABOLIC PANEL WITH GFR   [facility] PHARMACOGEN[facility] SERVICE-BLOOD   Controlled substance agreement signed   Other Visit Diagnoses     Annual physical exam    -  Primary   Screening for thyroid disorder       Relevant Orders   TSH   Encounter for screening mammogram for breast cancer       Screening for diabetes mellitus       Relevant Orders   Hemoglobin A1c        Follow up plan: Return in about 4 weeks (around 11/20/2020) for mood follow up - virual okay.   LABORATORY TESTING:  - Pap smear: not applicable  IMMUNIZATIONS:   - Tdap: Tetanus vaccination status reviewed: last tetanus booster within 10 years. - Influenza: Postponed to flu season - Pneumovax: Not applicable - Prevnar: Not applicable - HPV: Not applicable - Zostavax vaccine: Not applicable - COVID-19 vaccine: Has had 2 doses and 1 booster  SCREENING: -Mammogram: Up to date  - Colonoscopy: Up to date  - Bone  Density: Not applicable  -Hearing Test: Not applicable  -Spirometry: Not applicable   PATIENT COUNSELING:   Advised to avoid cigarette smoking.  I discussed with the patient that most people either abstain from alcohol or drink within safe limits (<=14/week and <=4 drinks/occasion for males, <=7/weeks and <= 3 drinks/occasion for females) and that the risk for alcohol disorders and other health effects rises proportionally with the number of drinks per week and how often a drinker exceeds daily limits.  Discussed cessation/primary prevention of drug use and availability of treatment for abuse.   Diet: Encouraged to adjust caloric intake to maintain  or achieve ideal body weight, to reduce intake of dietary saturated fat and total fat, to limit sodium intake by avoiding high  sodium foods and not adding table salt, and to maintain adequate dietary potassium and calcium preferably from fresh fruits, vegetables, and low-fat dairy products.    Stressed the importance of regular exercise   Dental health: Discussed importance of regular tooth brushing, flossing, and dental visits.    NEXT PREVENTATIVE PHYSICAL DUE IN 1 YEAR. Return in about 4 weeks (around 11/20/2020) for mood follow up - virual okay.

## 2020-10-23 ENCOUNTER — Encounter: Payer: Self-pay | Admitting: Nurse Practitioner

## 2020-10-23 ENCOUNTER — Other Ambulatory Visit: Payer: Self-pay

## 2020-10-23 ENCOUNTER — Ambulatory Visit (HOSPITAL_COMMUNITY)
Admission: RE | Admit: 2020-10-23 | Discharge: 2020-10-23 | Disposition: A | Discharge status: Home / Self Care | Payer: BC Managed Care – PPO | Source: Ambulatory Visit | Attending: Nurse Practitioner | Admitting: Nurse Practitioner

## 2020-10-23 ENCOUNTER — Other Ambulatory Visit: Payer: Self-pay | Admitting: Nurse Practitioner

## 2020-10-23 ENCOUNTER — Ambulatory Visit (INDEPENDENT_AMBULATORY_CARE_PROVIDER_SITE_OTHER): Payer: BC Managed Care – PPO | Admitting: Nurse Practitioner

## 2020-10-23 VITALS — BP 122/74 | HR 88 | Temp 98.0°F | Resp 16 | Ht 62.0 in | Wt 168.0 lb

## 2020-10-23 DIAGNOSIS — Z79899 Other long term (current) drug therapy: Secondary | ICD-10-CM | POA: Diagnosis not present

## 2020-10-23 DIAGNOSIS — E7800 Pure hypercholesterolemia, unspecified: Secondary | ICD-10-CM | POA: Diagnosis not present

## 2020-10-23 DIAGNOSIS — R12 Heartburn: Secondary | ICD-10-CM | POA: Diagnosis not present

## 2020-10-23 DIAGNOSIS — M9689 Other intraoperative and postprocedural complications and disorders of the musculoskeletal system: Secondary | ICD-10-CM | POA: Diagnosis not present

## 2020-10-23 DIAGNOSIS — F411 Generalized anxiety disorder: Secondary | ICD-10-CM | POA: Diagnosis not present

## 2020-10-23 DIAGNOSIS — Z1231 Encounter for screening mammogram for malignant neoplasm of breast: Secondary | ICD-10-CM | POA: Insufficient documentation

## 2020-10-23 DIAGNOSIS — Z1329 Encounter for screening for other suspected endocrine disorder: Secondary | ICD-10-CM

## 2020-10-23 DIAGNOSIS — Z0001 Encounter for general adult medical examination with abnormal findings: Secondary | ICD-10-CM | POA: Diagnosis not present

## 2020-10-23 DIAGNOSIS — Z0000 Encounter for general adult medical examination without abnormal findings: Secondary | ICD-10-CM

## 2020-10-23 DIAGNOSIS — Z131 Encounter for screening for diabetes mellitus: Secondary | ICD-10-CM

## 2020-10-23 DIAGNOSIS — M79672 Pain in left foot: Secondary | ICD-10-CM | POA: Diagnosis not present

## 2020-10-23 MED ORDER — ALPRAZOLAM 0.5 MG PO TABS: 0.5000 mg | ORAL_TABLET | Freq: Three times a day (TID) | ORAL | 0 refills | Status: DC | PRN

## 2020-10-23 NOTE — Assessment & Plan Note (Signed)
Chronic.  We will recheck lipids today- previous ASCVD risk has been low.  Patient is fasting today.  Follow-up pending blood work.

## 2020-10-23 NOTE — Assessment & Plan Note (Addendum)
Chronic, uncontrolled.  Patient is weary to start another antianxiety medication as she has had poor reactions to a couple in the past.  She is requesting genetic testing-I will order today.  Discussed benzodiazepine use at length with her today.  Options include referral to psychiatry versus daily antianxiety medicine like SSRIs/SNRI and cutting down on use of benzodiazepine.  I am not comfortable prescribing long-term benzodiazepines for multiple times use per day and she was made aware of this multiple times.  Pt is aware of risks of psychoactive medication use to include increased sedation, respiratory suppression, falls, extrapyramidal movements,  dependence and cardiovascular events.  Pt would like to continue treatment as benefit determined to outweigh risk.  We will await the results of the genetic testing- she is agreeable to start something that may be suitable for her after this blood work.  She declines referral to psychiatry.  PDMP reviewed- refill given for alprazolam 0.5 mg up to 3 times daily as needed for anxiety.  She is aware that this prescription should last until her next appointment.  She is also aware that I will not be prescribing this long-term for her.  Follow-up in 1 month.

## 2020-10-23 NOTE — Assessment & Plan Note (Signed)
Chronic.  Stable with daily omeprazole 20 mg daily.  No red flags in history or on examination today-we will check magnesium and CBC today.  Follow-up if omeprazole stops helping or if new symptoms develop.  Follow-up 6 months.

## 2020-10-24 LAB — COMPLETE METABOLIC PANEL WITH GFR
AG Ratio: 1.5 (calc) (ref 1.0–2.5)
ALT: 17 U/L (ref 6–29)
AST: 20 U/L (ref 10–35)
Albumin: 4.3 g/dL (ref 3.6–5.1)
Alkaline phosphatase (APISO): 77 U/L (ref 37–153)
BUN: 14 mg/dL (ref 7–25)
CO2: 31 mmol/L (ref 20–32)
Calcium: 9.7 mg/dL (ref 8.6–10.4)
Chloride: 100 mmol/L (ref 98–110)
Creat: 0.83 mg/dL (ref 0.50–1.03)
Globulin: 2.8 g/dL (calc) (ref 1.9–3.7)
Glucose, Bld: 103 mg/dL — ABNORMAL HIGH (ref 65–99)
Potassium: 4.4 mmol/L (ref 3.5–5.3)
Sodium: 137 mmol/L (ref 135–146)
Total Bilirubin: 0.4 mg/dL (ref 0.2–1.2)
Total Protein: 7.1 g/dL (ref 6.1–8.1)
eGFR: 84 mL/min/{1.73_m2} (ref >=60)

## 2020-10-24 LAB — LIPID PANEL
Cholesterol: 237 mg/dL — ABNORMAL HIGH
HDL: 71 mg/dL
LDL Cholesterol (Calc): 147 mg/dL — ABNORMAL HIGH
Non-HDL Cholesterol (Calc): 166 mg/dL — ABNORMAL HIGH
Total CHOL/HDL Ratio: 3.3 (calc)
Triglycerides: 89 mg/dL

## 2020-10-24 LAB — CBC WITH DIFFERENTIAL/PLATELET
Absolute Monocytes: 507 {cells}/uL (ref 200–950)
Basophils Absolute: 40 {cells}/uL (ref 0–200)
Basophils Relative: 0.7 %
Eosinophils Absolute: 148 {cells}/uL (ref 15–500)
Eosinophils Relative: 2.6 %
HCT: 38 % (ref 35.0–45.0)
Hemoglobin: 13 g/dL (ref 11.7–15.5)
Lymphs Abs: 2502 {cells}/uL (ref 850–3900)
MCH: 33.2 pg — ABNORMAL HIGH (ref 27.0–33.0)
MCHC: 34.2 g/dL (ref 32.0–36.0)
MCV: 97.2 fL (ref 80.0–100.0)
MPV: 10 fL (ref 7.5–12.5)
Monocytes Relative: 8.9 %
Neutro Abs: 2502 {cells}/uL (ref 1500–7800)
Neutrophils Relative %: 43.9 %
Platelets: 258 Thousand/uL (ref 140–400)
RBC: 3.91 Million/uL (ref 3.80–5.10)
RDW: 12.1 % (ref 11.0–15.0)
Total Lymphocyte: 43.9 %
WBC: 5.7 Thousand/uL (ref 3.8–10.8)

## 2020-10-24 LAB — VITAMIN D 25 HYDROXY (VIT D DEFICIENCY, FRACTURES): Vit D, 25-Hydroxy: 47 ng/mL (ref 30–100)

## 2020-10-24 LAB — MAGNESIUM: Magnesium: 2.1 mg/dL (ref 1.5–2.5)

## 2020-10-24 LAB — HEMOGLOBIN A1C
Hgb A1c MFr Bld: 5.6 %{Hb}
Mean Plasma Glucose: 114 mg/dL
eAG (mmol/L): 6.3 mmol/L

## 2020-10-24 LAB — TSH: TSH: 2.14 mIU/L

## 2020-10-26 ENCOUNTER — Other Ambulatory Visit (HOSPITAL_COMMUNITY): Payer: Self-pay | Admitting: Podiatry

## 2020-10-26 ENCOUNTER — Other Ambulatory Visit: Payer: Self-pay | Admitting: Podiatry

## 2020-10-26 DIAGNOSIS — S92902G Unspecified fracture of left foot, subsequent encounter for fracture with delayed healing: Secondary | ICD-10-CM

## 2020-11-03 ENCOUNTER — Ambulatory Visit (HOSPITAL_COMMUNITY)
Admission: RE | Admit: 2020-11-03 | Discharge: 2020-11-03 | Disposition: A | Discharge status: Home / Self Care | Payer: BC Managed Care – PPO | Source: Ambulatory Visit | Attending: Podiatry | Admitting: Podiatry

## 2020-11-03 ENCOUNTER — Other Ambulatory Visit: Payer: Self-pay

## 2020-11-03 DIAGNOSIS — M7732 Calcaneal spur, left foot: Secondary | ICD-10-CM | POA: Diagnosis not present

## 2020-11-03 DIAGNOSIS — S92902G Unspecified fracture of left foot, subsequent encounter for fracture with delayed healing: Secondary | ICD-10-CM | POA: Insufficient documentation

## 2020-11-03 DIAGNOSIS — M7989 Other specified soft tissue disorders: Secondary | ICD-10-CM | POA: Diagnosis not present

## 2020-11-03 DIAGNOSIS — Z981 Arthrodesis status: Secondary | ICD-10-CM | POA: Diagnosis not present

## 2020-11-10 DIAGNOSIS — M960 Pseudarthrosis after fusion or arthrodesis: Secondary | ICD-10-CM | POA: Diagnosis not present

## 2020-11-22 NOTE — Progress Notes (Signed)
Subjective:    Patient ID: Ashley [name], female    DOB: [birth year], 55 y.o.   MRN: [id number]  HPI: Ashley [name] is a 55 y.o. female presenting virtually for follow up.  Chief Complaint  Patient presents with   Follow-up   ANXIETY/STRESS Follow up for anxiety and refill of alprazolam.  We are planning to do genomic testing, however this has not been arranged yet.  She is currently taking alprazolam about every 8 hours to help with anxiety.   Duration: chronic Anxious mood: yes  Excessive worrying: yes Irritability: no  Sweating: no Nausea: no Palpitations:no Hyperventilation: no Panic attacks: no Agoraphobia: no  Obscessions/compulsions: no Depressed mood: no Depression screen [facility] 2/9 10/23/2020 04/15/2020 02/05/2020 10/22/2019 08/12/2019  Decreased Interest 0 0 1 1 1   Down, Depressed, Hopeless 0 0 1 1 3   PHQ - 2 Score 0 0 2 2 4   Altered sleeping 0 0 1 1 2   Tired, decreased energy 1 1 1 3 1   Change in appetite 0 0 1 0 0  Feeling bad or failure about yourself  0 0 1 0 0  Trouble concentrating 0 0 1 0 2  Moving slowly or fidgety/restless 0 0 0 0 2  Suicidal thoughts 0 0 0 0 0  PHQ-9 Score 1 1 7 6 11   Difficult doing work/chores Not difficult at all - Somewhat difficult Somewhat difficult Somewhat difficult  Some recent data might be hidden    GAD 7 : Generalized Anxiety Score 04/15/2020 02/05/2020 10/22/2019 07/30/2019  Nervous, Anxious, on Edge 2 3 1 3   Control/stop worrying 2 2 1 3   Worry too much - different things 2 2 1 3   Trouble relaxing 0 2 1 3   Restless 1 2 1 3   Easily annoyed or irritable 0 1 0 3  Afraid - awful might happen 0 2 1 3   Total GAD 7 Score 7 14 6 21   Anxiety Difficulty - Somewhat difficult Somewhat difficult Very difficult   Anhedonia: no Weight changes: no Insomnia: no  Hypersomnia: no Fatigue/loss of energy: no Feelings of worthlessness: no Feelings of guilt: no Impaired concentration/indecisiveness: no  Allergies  Allergen Reactions   Bee  Venom Hives   Statins     Muscle aches    Outpatient Encounter Medications as of 11/23/2020  Medication Sig   ALPRAZolam (XANAX) 0.5 MG tablet Take 1 tablet (0.5 mg total) by mouth 3 (three) times daily as needed for anxiety.   naproxen sodium (ALEVE) 220 MG tablet Take 220 mg by mouth 2 (two) times daily as needed (pain).   omeprazole (PRILOSEC OTC) 20 MG tablet Take 20 mg by mouth daily.   OVER THE COUNTER MEDICATION Take 6 tablets by mouth daily. Balanace of nature fruits and vegetables   [DISCONTINUED] ALPRAZolam (XANAX) 0.5 MG tablet Take 1 tablet (0.5 mg total) by mouth 3 (three) times daily as needed for anxiety.   No facility-administered encounter medications on file as of 11/23/2020.    Patient Active Problem List   Diagnosis Date Noted   Controlled substance agreement signed 10/23/2020   MDD (major depressive disorder) 07/16/2019   Grief reaction 05/08/2019   Vaginal atrophy 05/12/2017   Special screening for malignant neoplasms, colon 03/15/2017   Family history of colon cancer 03/15/2017   Heartburn 02/17/2016   Pelvic floor dysfunction 11/17/2015   Constipation 09/17/2015   Overweight 09/16/2015   Insomnia 12/17/2013   Hyperlipidemia 12/05/2011   GAD (generalized anxiety disorder) 11/17/2009  Past Medical History:  Diagnosis Date   Anxiety    Hyperlipidemia     Relevant past medical, surgical, family and social history reviewed and updated as indicated. Interim medical history since our last visit reviewed.  Review of Systems Per HPI unless specifically indicated above     Objective:    There were no vitals taken for this visit.  Wt Readings from Last 3 Encounters:  10/23/20 168 lb (76.2 kg)  04/15/20 171 lb (77.6 kg)  02/05/20 176 lb (79.8 kg)    Physical Exam Vitals and nursing note reviewed.  Constitutional:      General: She is not in acute distress.    Appearance: Normal appearance. She is not toxic-appearing.  Skin:    Coloration: Skin is  not jaundiced or pale.     Findings: No erythema.  Neurological:     Mental Status: She is alert and oriented to person, place, and time.  Psychiatric:        Mood and Affect: Mood normal.        Behavior: Behavior normal.        Thought Content: Thought content normal.        Judgment: Judgment normal.      Assessment & Plan:   Problem List Items Addressed This Visit       Other   GAD (generalized anxiety disorder)    Chronic.  Genomic testing is pending to determine a possible alternative therapy to alprazolam.  Has tried Buspar in the past and does not want to try others without genetic testing.  Pt is aware of risks of psychoactive medication use to include increased sedation, respiratory suppression, falls, extrapyramidal movements,  dependence and cardiovascular events.  Pt would like to continue treatment as benefit determined to outweigh risk.  She is aware I will not prescribe alprazolam 0.5 mg three times daily as needed for long-term use.  PDMP reviewed and appropriate; refill given for alprazolam 0.5 mg up to three times daily as needed.  Will plan to follow up in 2 months.        Relevant Medications   ALPRAZolam (XANAX) 0.5 MG tablet     Follow up plan: Return in about 2 months (around 01/23/2021) for anxiety follow up.  Due to the catastrophic nature of the COVID-19 pandemic, this video visit was completed soley via audio and visual contact via [employer] due to the restrictions of the COVID-19 pandemic.  All issues as above were discussed and addressed. Physical exam was done as above through visual confirmation on [employer]. If it was [name] that the patient should be evaluated in the office, they were directed there. The patient verbally consented to this visit. Location of the patient: home Location of the provider: work Those involved with this call:  Provider: Cathlean Marseilles, DNP, FNP-C CMA: n/a Front Desk/Registration:  Claudine Mouton Time spent on call:  5  minutes with patient face to face via video conference. More than 50% of this time was spent in counseling and coordination of care. 15 minutes total spent in review of patient's record and preparation of their chart. I verified patient identity using two factors (patient name and date of birth). Patient consents verbally to being seen via telemedicine visit today.

## 2020-11-23 ENCOUNTER — Encounter: Payer: Self-pay | Admitting: Nurse Practitioner

## 2020-11-23 ENCOUNTER — Other Ambulatory Visit: Payer: Self-pay

## 2020-11-23 ENCOUNTER — Telehealth (INDEPENDENT_AMBULATORY_CARE_PROVIDER_SITE_OTHER): Payer: BC Managed Care – PPO | Admitting: Nurse Practitioner

## 2020-11-23 DIAGNOSIS — F411 Generalized anxiety disorder: Secondary | ICD-10-CM

## 2020-11-23 MED ORDER — ALPRAZOLAM 0.5 MG PO TABS: 0.5000 mg | ORAL_TABLET | Freq: Three times a day (TID) | ORAL | 1 refills | Status: DC | PRN

## 2020-11-23 NOTE — Assessment & Plan Note (Signed)
Chronic.  Genomic testing is pending to determine a possible alternative therapy to alprazolam.  Has tried Buspar in the past and does not want to try others without genetic testing.  Pt is aware of risks of psychoactive medication use to include increased sedation, respiratory suppression, falls, extrapyramidal movements,  dependence and cardiovascular events.  Pt would like to continue treatment as benefit determined to outweigh risk.  She is aware I will not prescribe alprazolam 0.5 mg three times daily as needed for long-term use.  PDMP reviewed and appropriate; refill given for alprazolam 0.5 mg up to three times daily as needed.  Will plan to follow up in 2 months.

## 2020-12-01 DIAGNOSIS — M960 Pseudarthrosis after fusion or arthrodesis: Secondary | ICD-10-CM | POA: Diagnosis not present

## 2020-12-01 DIAGNOSIS — Z981 Arthrodesis status: Secondary | ICD-10-CM | POA: Diagnosis not present

## 2020-12-01 DIAGNOSIS — Z01818 Encounter for other preprocedural examination: Secondary | ICD-10-CM | POA: Diagnosis not present

## 2020-12-01 DIAGNOSIS — M7732 Calcaneal spur, left foot: Secondary | ICD-10-CM | POA: Diagnosis not present

## 2020-12-03 DIAGNOSIS — F419 Anxiety disorder, unspecified: Secondary | ICD-10-CM | POA: Diagnosis not present

## 2020-12-03 DIAGNOSIS — E785 Hyperlipidemia, unspecified: Secondary | ICD-10-CM | POA: Diagnosis not present

## 2020-12-03 DIAGNOSIS — K219 Gastro-esophageal reflux disease without esophagitis: Secondary | ICD-10-CM | POA: Diagnosis not present

## 2020-12-03 DIAGNOSIS — M960 Pseudarthrosis after fusion or arthrodesis: Secondary | ICD-10-CM | POA: Diagnosis not present

## 2020-12-03 DIAGNOSIS — Z9889 Other specified postprocedural states: Secondary | ICD-10-CM | POA: Diagnosis not present

## 2020-12-03 DIAGNOSIS — Z981 Arthrodesis status: Secondary | ICD-10-CM | POA: Diagnosis not present

## 2020-12-03 DIAGNOSIS — M7732 Calcaneal spur, left foot: Secondary | ICD-10-CM | POA: Diagnosis not present

## 2020-12-15 DIAGNOSIS — Z4889 Encounter for other specified surgical aftercare: Secondary | ICD-10-CM | POA: Diagnosis not present

## 2020-12-21 ENCOUNTER — Encounter: Payer: Self-pay | Admitting: Nurse Practitioner

## 2020-12-23 ENCOUNTER — Other Ambulatory Visit (HOSPITAL_COMMUNITY)
Admission: RE | Admit: 2020-12-23 | Discharge: 2020-12-23 | Disposition: A | Discharge status: Home / Self Care | Payer: BC Managed Care – PPO | Source: Ambulatory Visit | Attending: Nurse Practitioner | Admitting: Nurse Practitioner

## 2020-12-23 ENCOUNTER — Other Ambulatory Visit: Payer: Self-pay | Admitting: Nurse Practitioner

## 2020-12-23 ENCOUNTER — Other Ambulatory Visit: Payer: Self-pay | Admitting: *Deleted

## 2020-12-23 DIAGNOSIS — F411 Generalized anxiety disorder: Secondary | ICD-10-CM | POA: Insufficient documentation

## 2020-12-29 DIAGNOSIS — Z4889 Encounter for other specified surgical aftercare: Secondary | ICD-10-CM | POA: Diagnosis not present

## 2021-01-12 DIAGNOSIS — Z4889 Encounter for other specified surgical aftercare: Secondary | ICD-10-CM | POA: Diagnosis not present

## 2021-01-13 ENCOUNTER — Encounter: Payer: Self-pay | Admitting: Nurse Practitioner

## 2021-01-13 DIAGNOSIS — F411 Generalized anxiety disorder: Secondary | ICD-10-CM

## 2021-01-15 MED ORDER — ALPRAZOLAM 0.5 MG PO TABS: 0.5000 mg | ORAL_TABLET | Freq: Three times a day (TID) | ORAL | 1 refills | Status: DC | PRN

## 2021-01-18 ENCOUNTER — Other Ambulatory Visit (HOSPITAL_COMMUNITY)
Admission: RE | Admit: 2021-01-18 | Discharge: 2021-01-18 | Disposition: A | Discharge status: Home / Self Care | Payer: BC Managed Care – PPO | Source: Ambulatory Visit | Attending: Nurse Practitioner | Admitting: Nurse Practitioner

## 2021-01-18 DIAGNOSIS — F411 Generalized anxiety disorder: Secondary | ICD-10-CM | POA: Insufficient documentation

## 2021-01-18 LAB — ACTX PHARMACOGENOMICS SERVICE-BLOOD

## 2021-01-26 DIAGNOSIS — Z4889 Encounter for other specified surgical aftercare: Secondary | ICD-10-CM | POA: Diagnosis not present

## 2021-03-08 ENCOUNTER — Encounter: Payer: Self-pay | Admitting: Nurse Practitioner

## 2021-03-22 ENCOUNTER — Encounter: Payer: Self-pay | Admitting: Nurse Practitioner

## 2021-03-22 ENCOUNTER — Ambulatory Visit (INDEPENDENT_AMBULATORY_CARE_PROVIDER_SITE_OTHER): Payer: BLUE CROSS/BLUE SHIELD | Admitting: Nurse Practitioner

## 2021-03-22 ENCOUNTER — Other Ambulatory Visit: Payer: Self-pay

## 2021-03-22 DIAGNOSIS — F411 Generalized anxiety disorder: Secondary | ICD-10-CM | POA: Diagnosis not present

## 2021-03-22 MED ORDER — ALPRAZOLAM 0.5 MG PO TABS: 0.5000 mg | ORAL_TABLET | Freq: Three times a day (TID) | ORAL | 2 refills | Status: DC | PRN

## 2021-03-22 NOTE — Progress Notes (Signed)
Subjective:    Patient ID: Ashley [name], female    DOB: 1965/10/07, 56 y.o.   MRN: [id number]  HPI: Ashley [name] is a 56 y.o. female presenting for anti anxiety follow up.   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  She has done this schedule for 25 years.  In the past for anxiety, she has tried Paxil, Buspar, lorazepam, and diazepam.  She is requesting refill of alprazolam 0.5 mg  - she is taking every 8 hours almost daily.  On days off, she can meditate, exercise, etc. And she may only use 2.  Reports her work is very stressful and demanding right now.  She reports she did see a Psychiatrist a few years ago. Duration: years Anxious mood: yes   Depression screen [facility] 2/9 03/22/2021 10/23/2020 04/15/2020 02/05/2020 10/22/2019  Decreased Interest 2 0 0 1 1  Down, Depressed, Hopeless 0 0 0 1 1  PHQ - 2 Score 2 0 0 2 2  Altered sleeping 2 0 0 1 1  Tired, decreased energy 3 1 1 1 3   Change in appetite 2 0 0 1 0  Feeling bad or failure about yourself  0 0 0 1 0  Trouble concentrating 0 0 0 1 0  Moving slowly or fidgety/restless 0 0 0 0 0  Suicidal thoughts 0 0 0 0 0  PHQ-9 Score 9 1 1 7 6   Difficult doing work/chores Very difficult Not difficult at all - Somewhat difficult Somewhat difficult  Some recent data might be hidden    GAD 7 : Generalized Anxiety Score 03/22/2021 04/15/2020 02/05/2020 10/22/2019  Nervous, Anxious, on Edge 3 2 3 1   Control/stop worrying 2 2 2 1   Worry too much - different things 3 2 2 1   Trouble relaxing 2 0 2 1  Restless 2 1 2 1   Easily annoyed or irritable 2 0 1 0  Afraid - awful might happen 2 0 2 1  Total GAD 7 Score 16 7 14 6   Anxiety Difficulty Very difficult - Somewhat difficult Somewhat difficult   Allergies  Allergen Reactions   Bee Venom Hives   Statins     Muscle aches    Outpatient  Encounter Medications as of 03/22/2021  Medication Sig   naproxen sodium (ALEVE) 220 MG tablet Take 220 mg by mouth 2 (two) times daily as needed (pain).   omeprazole (PRILOSEC OTC) 20 MG tablet Take 20 mg by mouth daily.   OVER THE COUNTER MEDICATION Take 6 tablets by mouth daily. Balanace of nature fruits and vegetables   TURMERIC PO Take by mouth.   [DISCONTINUED] ALPRAZolam (XANAX) 0.5 MG tablet Take 1 tablet (0.5 mg total) by mouth 3 (three) times daily as needed for anxiety.   [START ON 03/25/2021] ALPRAZolam (XANAX) 0.5 MG tablet Take 1 tablet (0.5 mg total) by mouth 3 (three) times daily as needed for anxiety.   No facility-administered encounter medications on file as of 03/22/2021.    Patient Active Problem List   Diagnosis Date Noted   Controlled substance agreement signed 10/23/2020   MDD (major depressive disorder) 07/16/2019   Grief reaction 05/08/2019   Vaginal atrophy 05/12/2017   Special screening for malignant neoplasms, colon 03/15/2017   Family history of colon cancer 03/15/2017   Heartburn 02/17/2016   Pelvic  floor dysfunction 11/17/2015   Constipation 09/17/2015   Overweight 09/16/2015   Insomnia 12/17/2013   Hyperlipidemia 12/05/2011   GAD (generalized anxiety disorder) 11/17/2009    Past Medical History:  Diagnosis Date   Anxiety    Hyperlipidemia     Relevant past medical, surgical, family and social history reviewed and updated as indicated. Interim medical history since our last visit reviewed.  Review of Systems Per HPI unless specifically indicated above     Objective:    BP 128/90    Pulse 80    Ht 5\' 2"  (1.575 m)    Wt 176 lb (79.8 kg)    SpO2 97%    BMI 32.19 kg/m   Wt Readings from Last 3 Encounters:  03/22/21 176 lb (79.8 kg)  10/23/20 168 lb (76.2 kg)  04/15/20 171 lb (77.6 kg)    Physical Exam Vitals and nursing note reviewed.  Constitutional:      General: She is not in acute distress.    Appearance: Normal appearance. She is  obese. She is not toxic-appearing.  Cardiovascular:     Rate and Rhythm: Normal rate and regular rhythm.     Heart sounds: Normal heart sounds. No murmur heard. Pulmonary:     Effort: Pulmonary effort is normal. No respiratory distress.     Breath sounds: Normal breath sounds. No wheezing, rhonchi or rales.  Musculoskeletal:     Right lower leg: No edema.     Left lower leg: No edema.  Skin:    General: Skin is warm and dry.     Coloration: Skin is not jaundiced or pale.     Findings: No erythema.  Neurological:     Mental Status: She is alert and oriented to person, place, and time.     Motor: No weakness.     Gait: Gait normal.  Psychiatric:        Mood and Affect: Mood normal.        Behavior: Behavior normal.        Thought Content: Thought content normal.        Judgment: Judgment normal.    Results for orders placed or performed during the hospital encounter of 01/18/21  [facility]  Result Value Ref Range   [facility] Result Specimen collected. Sent to reference laboratory.       Assessment & Plan:   Problem List Items Addressed This Visit       Other   GAD (generalized anxiety disorder)    Chronic.  PHQ-9 and GAD-7 are elevated and stable today.  She does endorse increased stress with her current work situation.  Genomic testing has come back and she shows me on her cell phone today - does have strong risk of side effects with certain SSRIs like Celexa.  She does not desire starting on any new medication today.  Pt is aware of risks of psychoactive medication use to include increased sedation, respiratory suppression, falls, extrapyramidal movements,  dependence and cardiovascular events.  Pt would like to continue treatment as benefit determined to outweigh risk.  PDMP reviewed and appropriate.  Refill given for alprazolam 0.5 mg up to 3 times daily as needed for anxiety for 3 months.  Follow up with new PCP.      Relevant Medications   ALPRAZolam  (XANAX) 0.5 MG tablet (Start on 03/25/2021)     Follow up plan: No follow-ups on file.

## 2021-03-22 NOTE — Assessment & Plan Note (Signed)
Chronic.  PHQ-9 and GAD-7 are elevated and stable today.  She does endorse increased stress with her current work situation.  Genomic testing has come back and she shows me on her cell phone today - does have strong risk of side effects with certain SSRIs like Celexa.  She does not desire starting on any new medication today.  Pt is aware of risks of psychoactive medication use to include increased sedation, respiratory suppression, falls, extrapyramidal movements,  dependence and cardiovascular events.  Pt would like to continue treatment as benefit determined to outweigh risk.  PDMP reviewed and appropriate.  Refill given for alprazolam 0.5 mg up to 3 times daily as needed for anxiety for 3 months.  Follow up with new PCP.

## 2021-05-25 ENCOUNTER — Encounter: Payer: Self-pay | Admitting: Nurse Practitioner

## 2021-05-25 ENCOUNTER — Other Ambulatory Visit: Payer: Self-pay

## 2021-05-25 ENCOUNTER — Ambulatory Visit (INDEPENDENT_AMBULATORY_CARE_PROVIDER_SITE_OTHER): Payer: BLUE CROSS/BLUE SHIELD | Admitting: Nurse Practitioner

## 2021-05-25 VITALS — BP 120/89 | HR 66 | Temp 95.7°F | Ht 62.0 in | Wt 177.0 lb

## 2021-05-25 DIAGNOSIS — Z7689 Persons encountering health services in other specified circumstances: Secondary | ICD-10-CM

## 2021-05-25 DIAGNOSIS — F411 Generalized anxiety disorder: Secondary | ICD-10-CM

## 2021-05-25 DIAGNOSIS — E7800 Pure hypercholesterolemia, unspecified: Secondary | ICD-10-CM

## 2021-05-25 NOTE — Progress Notes (Signed)
? ?Subjective:  ? ? Patient ID: Ashley [name], female    DOB: Jul 15, 1965, 56 y.o.   MRN: [id number] ? ?HPI ? ?56 year old female patient with history of GAD, pelvic floor dysfunction, hyperlipidemia, heartburn, and depression presents to clinic today to establish care. ? ?Patient's main concern is she would like to continue to keep her anxiety under control.  Patient currently takes Xanax 0.5 mg 3 times a day.  Patient states that she has been on Xanax for a long time as it was started by a psychiatric provider and then later taken on by her primary care provider.  Patient states that she will get frequent panic attacks and pelvic floor dysfunction without the use of Xanax.  Patient states that she has tried multiple psychiatrist and therapies and nothing has seemed to help her with her anxiety except for Xanax and Ativan.  Patient states that she also meditates, walks, does arts and crafts, and finds space to help with her anxiety.  Patient states that she is currently seeing a counselor with [facility] who she meets with once every 6 months via Zoom.   ? ?Patient states that a lot of her anxiety comes from the loss of her mother and brother 2 years ago and her work.  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 ? ?Patient also states that she has had some lab work done through [facility] test last year which she states many antidepressants do not work with her genetic make-up.  Pt had [facility] test last year.  ? ? ?Review of Systems  ?Psychiatric/Behavioral:    ?     Anxiety  ?All other systems reviewed and are negative. ? ?   ?Objective:  ? Physical Exam ?Constitutional:   ?   General: She is not in acute distress. ?   Appearance: Normal appearance. She is obese. She is not ill-appearing, toxic-appearing or diaphoretic.  ?Cardiovascular:  ?   Rate and Rhythm: Normal rate and regular rhythm.  ?   Pulses: Normal pulses.  ?   Heart sounds: Normal heart sounds. No murmur  heard. ?Pulmonary:  ?   Effort: Pulmonary effort is normal. No respiratory distress.  ?   Breath sounds: Normal breath sounds. No stridor. No wheezing, rhonchi or rales.  ?Chest:  ?   Chest wall: No tenderness.  ?Musculoskeletal:     ?   General: Normal range of motion.  ?Skin: ?   General: Skin is warm.  ?   Capillary Refill: Capillary refill takes less than 2 seconds.  ?Neurological:  ?   General: No focal deficit present.  ?   Mental Status: She is alert and oriented to person, place, and time.  ?Psychiatric:     ?   Mood and Affect: Mood normal.     ?   Behavior: Behavior normal.  ? ? ?   ?Assessment & Plan:  ? ?1. Encounter to establish care ?-Patient to follow-up in 3 months. ? ?2. Pure hypercholesterolemia ?-Last LDL was 147.  However ASCVD 10-year risk score less than 5.  No statin indicated at this time. ?-We will recheck lipid panel today. ?- CMP14+EGFR ?- Lipid Panel With LDL/HDL Ratio ? ?3. GAD (generalized anxiety disorder) ?-Discussed management of anxiety in detail. ?-Discussed cognitive behavioral therapy as the mainstay of anxiety treatment.  Patient states that she has tried that in the past and feels that it was not as helpful. ?-Discussed  trying to wean patient off of Xanax where instead of patient taking 3 pills a day she takes 2 pills a day or 1 pill a day.  Patient reluctant as she does not believe she will have a lot of time for increased therapy or that she can find an alternative that would work for her anxiety. ?-I do not feel comfortable with prescribing long-term use of Xanax. ?-Patient referred to psychiatric services for management of Xanax. ?-I hope to get patient into psychiatric services within 4 weeks however I will provide bridge Xanax until patient is able to be established with psychiatric services in order to prevent any withdrawal symptoms. ?-I suspect patient will have withdrawal symptoms if she abruptly stops the medication since she has been on Xanax for such a long time  and due to the amount that she is taking.   ?-Withdrawal symptoms discussed in detail with patient. ?- Ambulatory referral to Psychiatry ?-Return to clinic in 4 weeks for anxiety evaluation ?

## 2021-05-26 LAB — LIPID PANEL WITH LDL/HDL RATIO
Cholesterol, Total: 240 mg/dL — ABNORMAL HIGH (ref 100–199)
HDL: 66 mg/dL (ref >39)
LDL Chol Calc (NIH): 143 mg/dL — ABNORMAL HIGH (ref 0–99)
LDL/HDL Ratio: 2.2 ratio (ref 0.0–3.2)
Triglycerides: 177 mg/dL — ABNORMAL HIGH (ref 0–149)
VLDL Cholesterol Cal: 31 mg/dL (ref 5–40)

## 2021-05-26 LAB — CMP14+EGFR
ALT: 24 IU/L (ref 0–32)
AST: 24 IU/L (ref 0–40)
Albumin/Globulin Ratio: 1.8 (ref 1.2–2.2)
Albumin: 4.9 g/dL (ref 3.8–4.9)
Alkaline Phosphatase: 106 IU/L (ref 44–121)
BUN/Creatinine Ratio: 20 (ref 9–23)
BUN: 16 mg/dL (ref 6–24)
Bilirubin Total: 0.2 mg/dL (ref 0.0–1.2)
CO2: 23 mmol/L (ref 20–29)
Calcium: 10.3 mg/dL — ABNORMAL HIGH (ref 8.7–10.2)
Chloride: 101 mmol/L (ref 96–106)
Creatinine, Ser: 0.79 mg/dL (ref 0.57–1.00)
Globulin, Total: 2.7 g/dL (ref 1.5–4.5)
Glucose: 111 mg/dL — ABNORMAL HIGH (ref 70–99)
Potassium: 4.1 mmol/L (ref 3.5–5.2)
Sodium: 141 mmol/L (ref 134–144)
Total Protein: 7.6 g/dL (ref 6.0–8.5)
eGFR: 88 mL/min/{1.73_m2} (ref >59)

## 2021-06-16 ENCOUNTER — Encounter: Payer: Self-pay | Admitting: Nurse Practitioner

## 2021-06-16 ENCOUNTER — Other Ambulatory Visit: Payer: Self-pay | Admitting: Nurse Practitioner

## 2021-06-16 DIAGNOSIS — F411 Generalized anxiety disorder: Secondary | ICD-10-CM

## 2021-06-16 MED ORDER — ALPRAZOLAM 0.5 MG PO TABS: 0.5000 mg | ORAL_TABLET | Freq: Three times a day (TID) | ORAL | 2 refills | Status: DC | PRN

## 2021-08-19 ENCOUNTER — Encounter: Payer: Self-pay | Admitting: Nurse Practitioner

## 2021-08-19 ENCOUNTER — Other Ambulatory Visit: Payer: Self-pay | Admitting: Nurse Practitioner

## 2021-08-19 DIAGNOSIS — F411 Generalized anxiety disorder: Secondary | ICD-10-CM

## 2021-08-25 ENCOUNTER — Ambulatory Visit: Payer: Self-pay | Admitting: Nurse Practitioner

## 2021-09-20 DIAGNOSIS — F411 Generalized anxiety disorder: Secondary | ICD-10-CM | POA: Diagnosis not present

## 2021-09-20 DIAGNOSIS — Z7689 Persons encountering health services in other specified circumstances: Secondary | ICD-10-CM | POA: Diagnosis not present

## 2021-09-20 DIAGNOSIS — Z6833 Body mass index (BMI) 33.0-33.9, adult: Secondary | ICD-10-CM | POA: Diagnosis not present

## 2021-09-21 ENCOUNTER — Encounter: Payer: Self-pay | Admitting: Nurse Practitioner

## 2021-10-09 IMAGING — DX DG FOOT COMPLETE 3+V*L*
3 series · 3 of 3 positions shown · non-contrast
Comparison: None.

CLINICAL DATA: Chronic left foot pain

EXAM:
LEFT FOOT - COMPLETE 3+ VIEW

[foot ap]
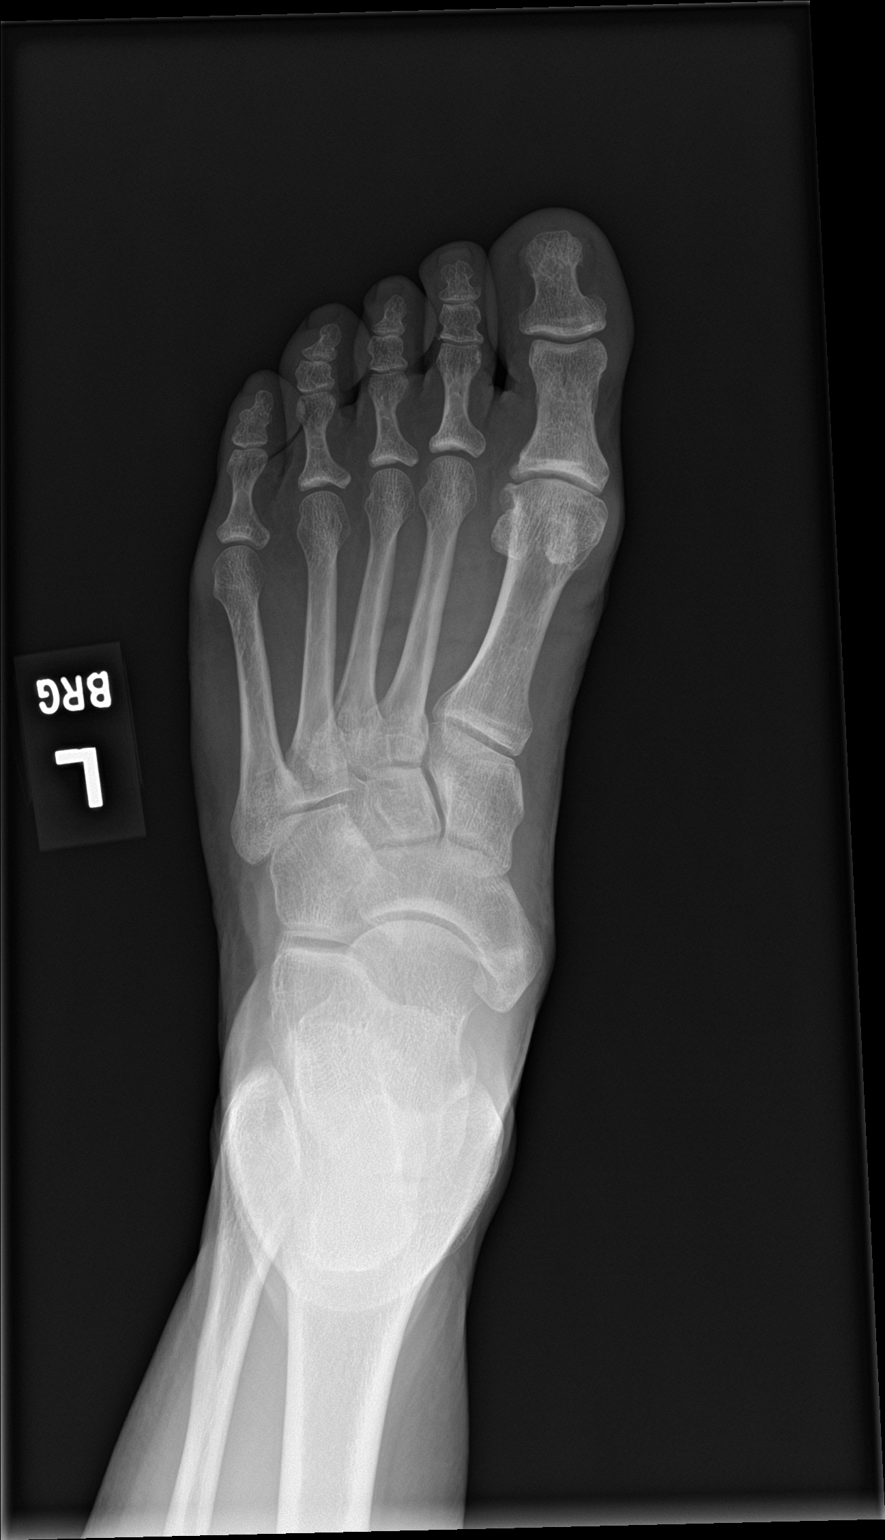

[foot obl]
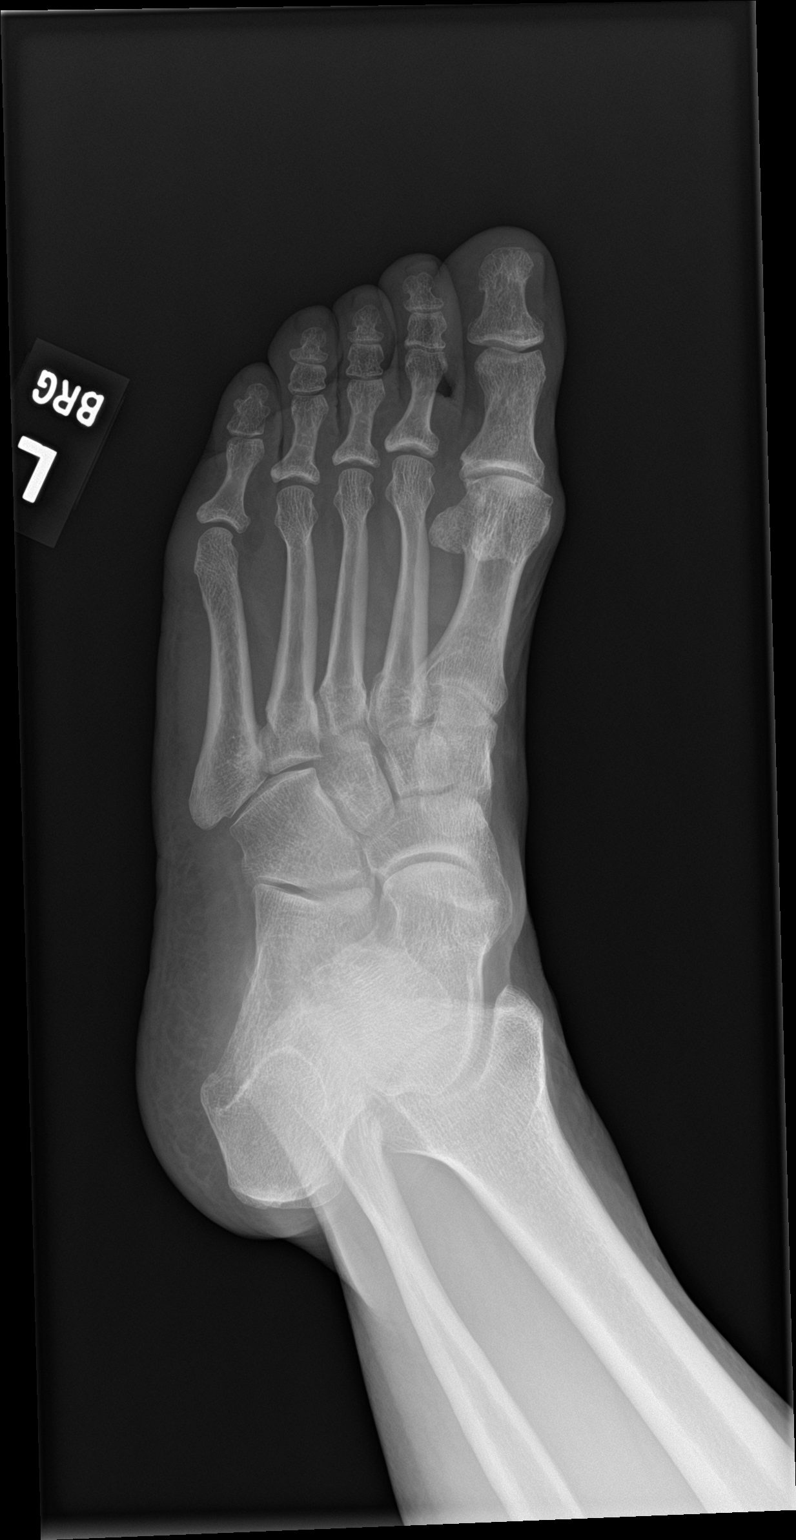

[foot lat]
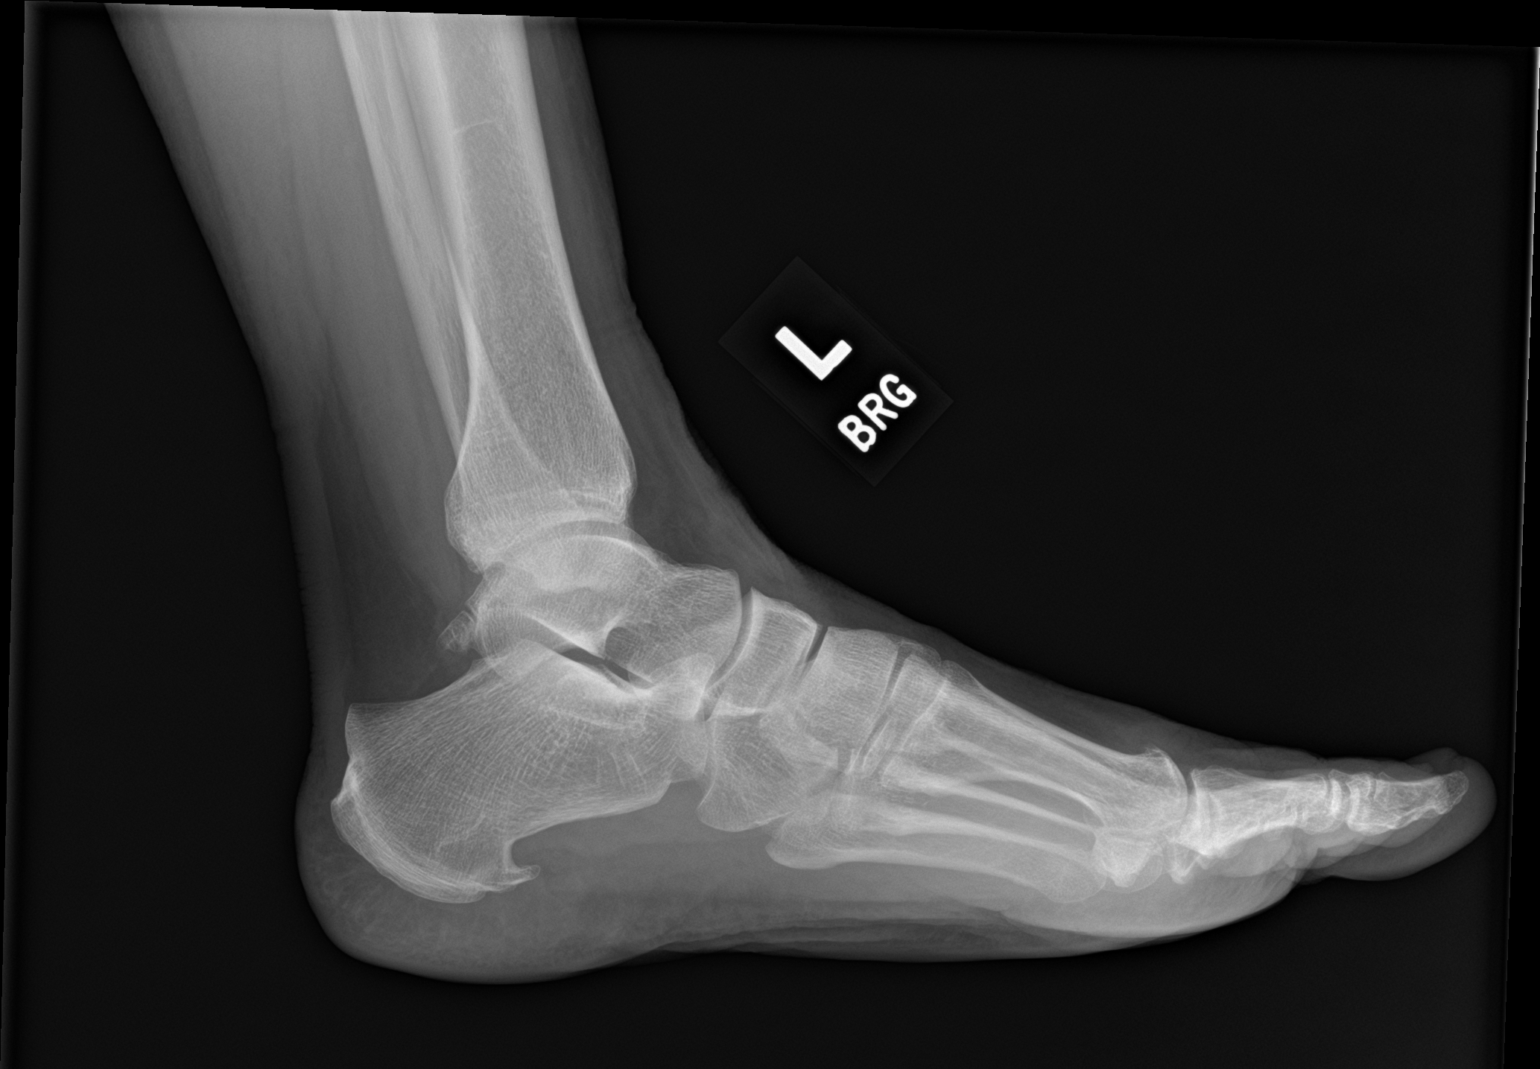

[3 of 3 positions shown; findings below may reference images not displayed]

FINDINGS: Bones: No acute fracture or dislocation. Normal bone mineralization.
No periostitis. Prominent os trigonum. Small plantar calcaneal spur.

Joints: Normal alignment. No erosive changes. Mild osteoarthritis of
the first MTP joint.

Soft tissue: No soft tissue abnormality. No radiopaque foreign body.
No chondrocalcinosis.
IMPRESSION: Mild osteoarthritis of the first MTP joint.

## 2021-10-11 IMAGING — RF DG FOOT 2V*L*
1 series · 2 of 2 positions shown · non-contrast
Comparison: Left foot x-rays dated December 16, 2019.

CLINICAL DATA: Left great toe fusion.

EXAM:
DG C-ARM 1-60 MIN; LEFT FOOT - 2 VIEW
FLUOROSCOPY TIME:  4 seconds.
C-arm fluoroscopic images were obtained intraoperatively and
submitted for post operative interpretation.

[Series 1: run · 2 of 2 slices shown]
[im 1/2]
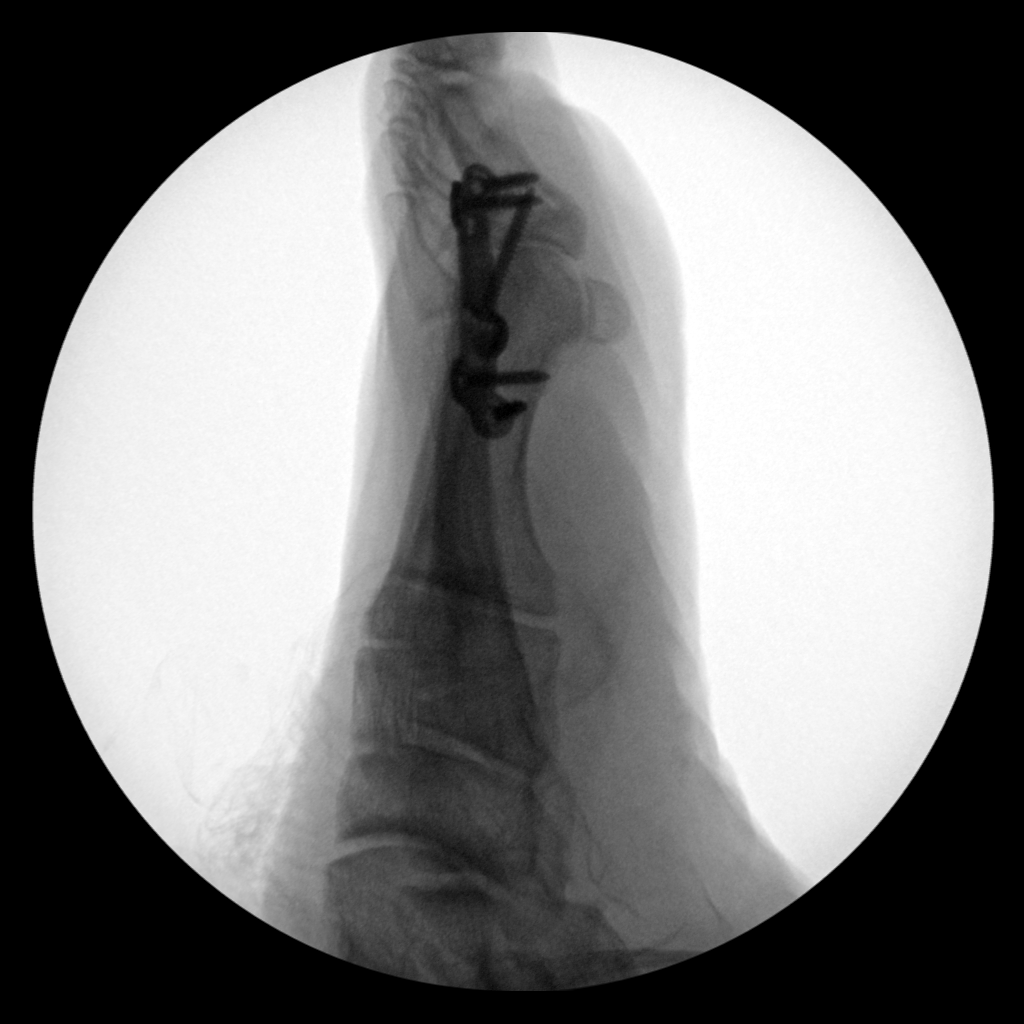
[im 2/2]
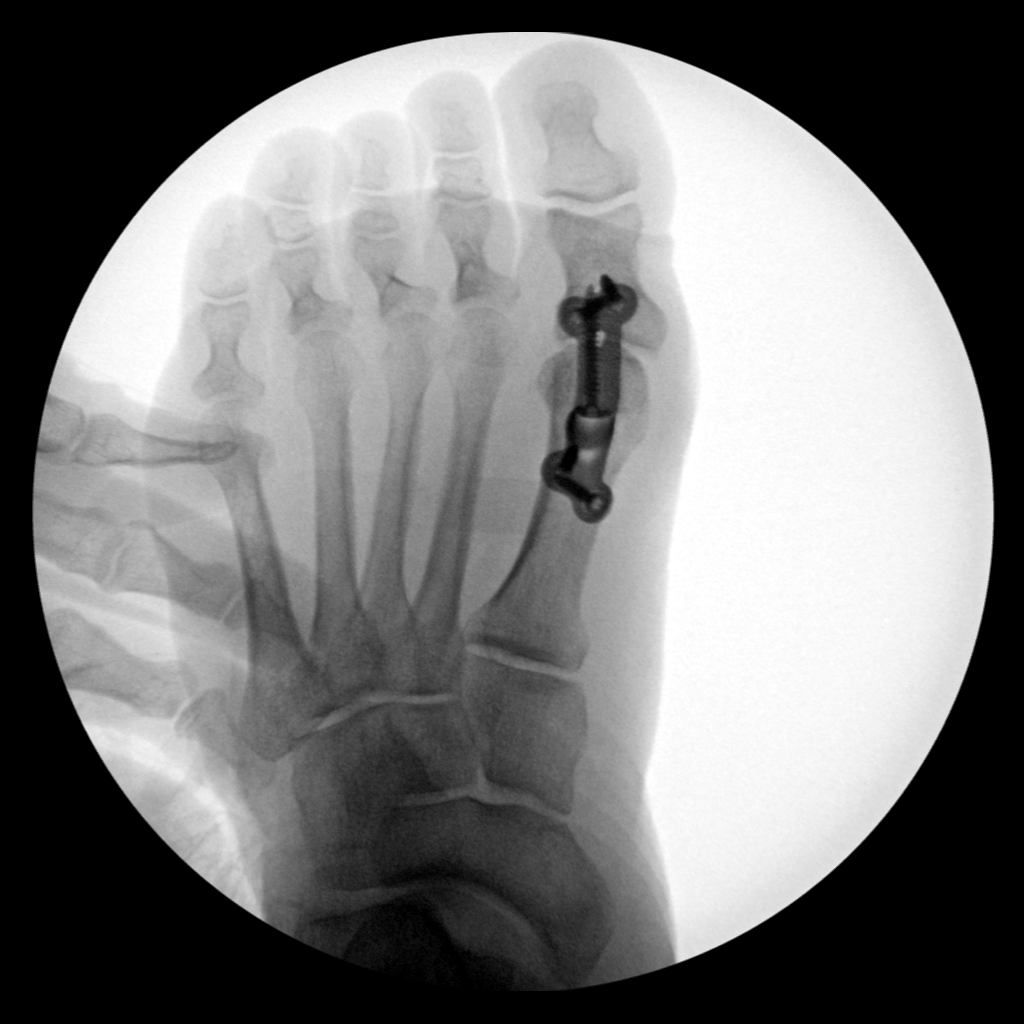

[2 of 2 positions shown; findings below may reference images not displayed]

FINDINGS: Frontal and lateral intraoperative fluoroscopic images of the left
forefoot demonstrate interval dorsal plate and screw arthrodesis of
the first MTP joint. No acute osseous abnormality.
IMPRESSION: Intraoperative fluoroscopic guidance for left first MTP joint
arthrodesis.

## 2021-10-11 IMAGING — DX DG FOOT COMPLETE 3+V*L*
3 series · 3 of 3 positions shown · non-contrast
Comparison: 12/16/2019

CLINICAL DATA: Left foot surgery

EXAM:
LEFT FOOT - COMPLETE 3+ VIEW

[foot ap]
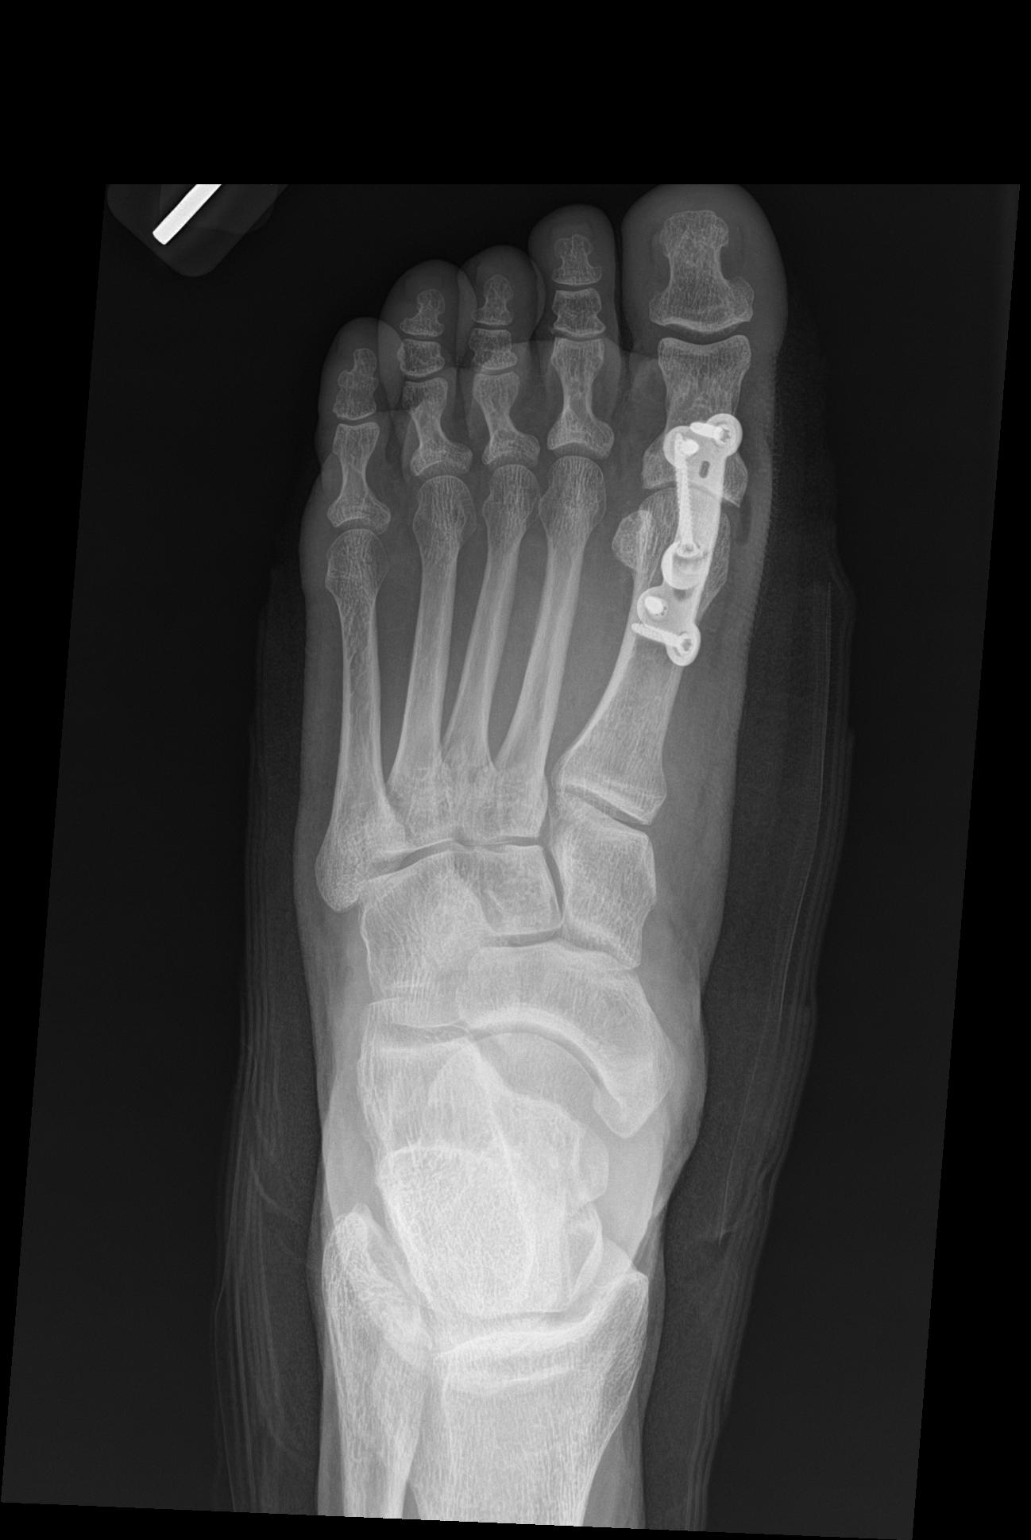

[foot obl]
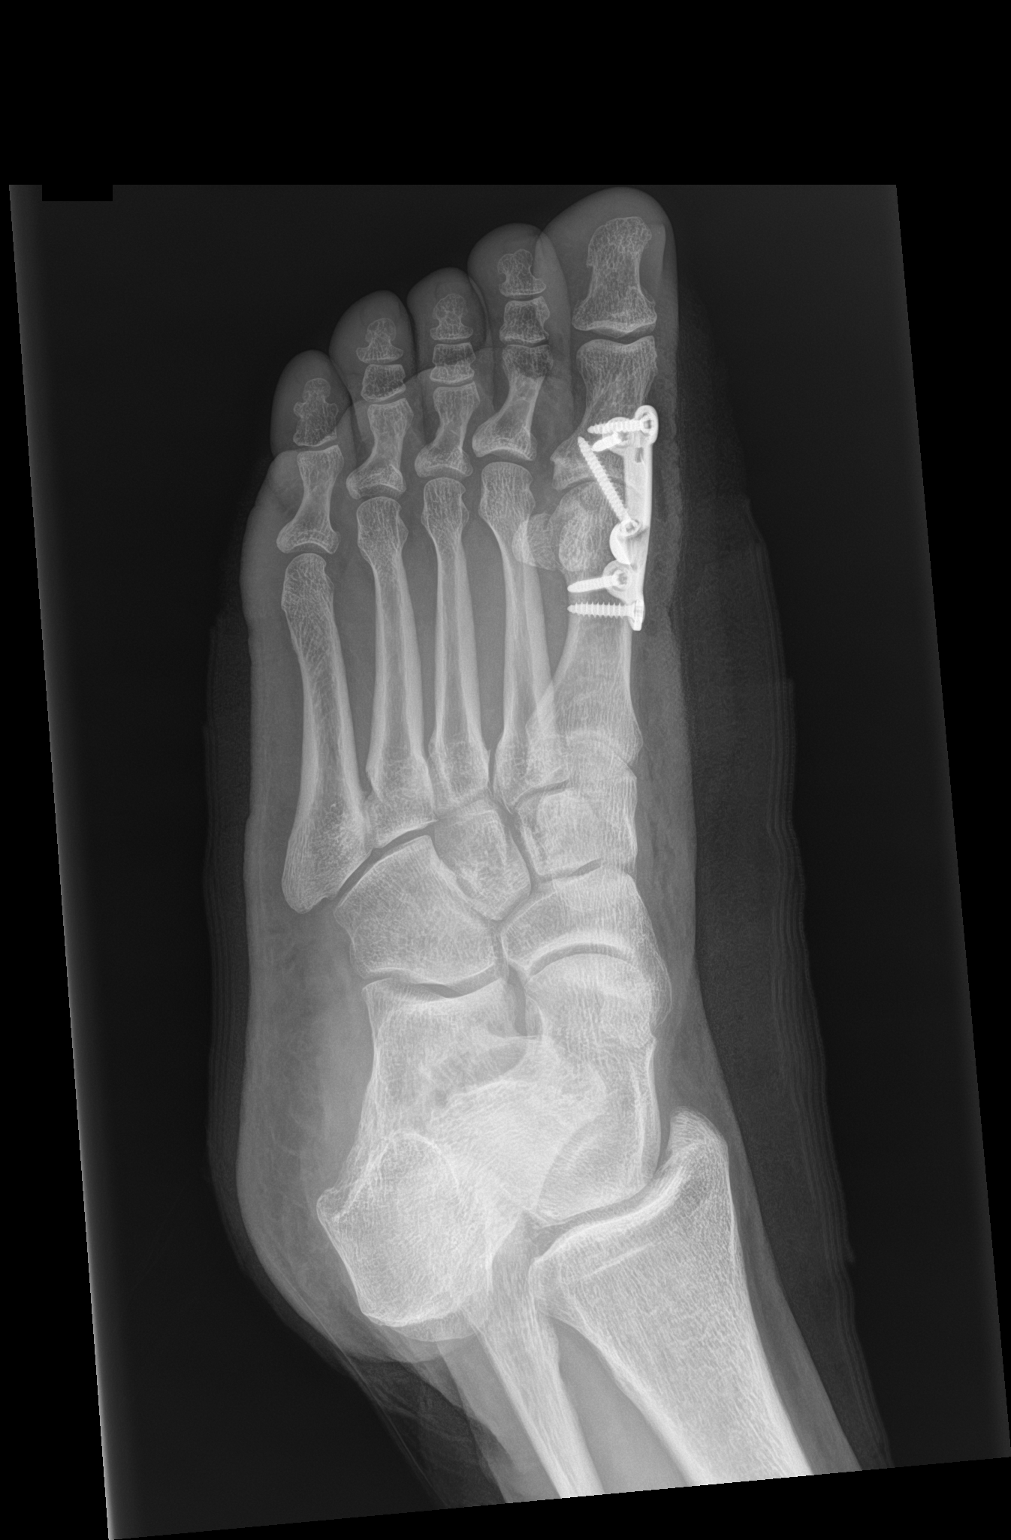

[foot lat]
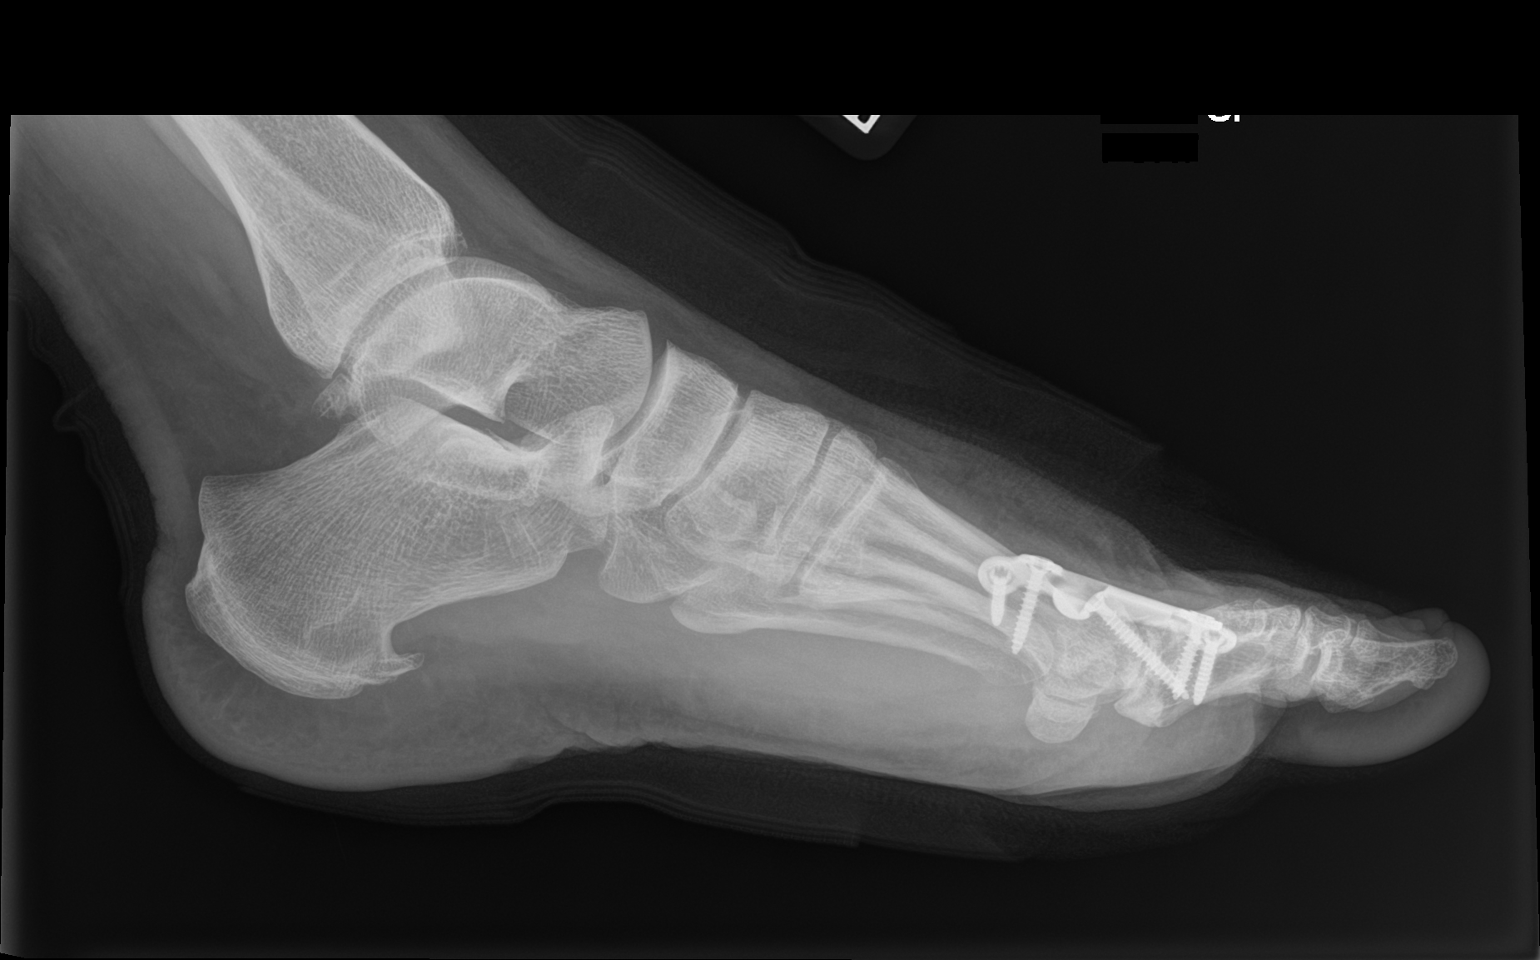

[3 of 3 positions shown; findings below may reference images not displayed]

FINDINGS: Interval postsurgical changes from first MTP arthrodesis with
traversing screw as well as dorsal side plate and screw fixation
construct. Hardware appears well seated without evidence of
immediate postoperative complication. Remaining joints appear within
normal limits. Small plantar calcaneal spur. Expected postoperative
changes within the soft tissues overlying the surgical site.
IMPRESSION: Status post first MTP arthrodesis without evidence of immediate
postoperative complication.

## 2021-10-20 ENCOUNTER — Other Ambulatory Visit (HOSPITAL_COMMUNITY): Payer: Self-pay

## 2021-10-20 DIAGNOSIS — Z1231 Encounter for screening mammogram for malignant neoplasm of breast: Secondary | ICD-10-CM

## 2021-10-21 DIAGNOSIS — Z13220 Encounter for screening for lipoid disorders: Secondary | ICD-10-CM | POA: Diagnosis not present

## 2021-10-21 DIAGNOSIS — E1165 Type 2 diabetes mellitus with hyperglycemia: Secondary | ICD-10-CM | POA: Diagnosis not present

## 2021-10-25 ENCOUNTER — Ambulatory Visit (HOSPITAL_COMMUNITY)
Admission: RE | Admit: 2021-10-25 | Discharge: 2021-10-25 | Disposition: A | Discharge status: Home / Self Care | Payer: BC Managed Care – PPO | Source: Ambulatory Visit

## 2021-10-25 DIAGNOSIS — Z1231 Encounter for screening mammogram for malignant neoplasm of breast: Secondary | ICD-10-CM | POA: Diagnosis not present

## 2021-10-25 DIAGNOSIS — Z23 Encounter for immunization: Secondary | ICD-10-CM | POA: Diagnosis not present

## 2021-10-25 DIAGNOSIS — Z0001 Encounter for general adult medical examination with abnormal findings: Secondary | ICD-10-CM | POA: Diagnosis not present

## 2022-01-06 DIAGNOSIS — Z23 Encounter for immunization: Secondary | ICD-10-CM | POA: Diagnosis not present

## 2022-01-12 DIAGNOSIS — E1165 Type 2 diabetes mellitus with hyperglycemia: Secondary | ICD-10-CM | POA: Diagnosis not present

## 2022-01-12 DIAGNOSIS — E785 Hyperlipidemia, unspecified: Secondary | ICD-10-CM | POA: Diagnosis not present

## 2022-01-18 DIAGNOSIS — F411 Generalized anxiety disorder: Secondary | ICD-10-CM | POA: Diagnosis not present

## 2022-01-19 DIAGNOSIS — E1165 Type 2 diabetes mellitus with hyperglycemia: Secondary | ICD-10-CM | POA: Diagnosis not present

## 2022-01-19 DIAGNOSIS — E785 Hyperlipidemia, unspecified: Secondary | ICD-10-CM | POA: Diagnosis not present

## 2022-01-19 DIAGNOSIS — F411 Generalized anxiety disorder: Secondary | ICD-10-CM | POA: Diagnosis not present

## 2022-01-19 DIAGNOSIS — Z6833 Body mass index (BMI) 33.0-33.9, adult: Secondary | ICD-10-CM | POA: Diagnosis not present

## 2022-07-01 ENCOUNTER — Encounter (INDEPENDENT_AMBULATORY_CARE_PROVIDER_SITE_OTHER): Payer: Self-pay | Admitting: *Deleted

## 2022-11-03 ENCOUNTER — Other Ambulatory Visit (HOSPITAL_COMMUNITY): Payer: Self-pay

## 2022-11-03 DIAGNOSIS — Z1231 Encounter for screening mammogram for malignant neoplasm of breast: Secondary | ICD-10-CM

## 2022-11-08 ENCOUNTER — Encounter (INDEPENDENT_AMBULATORY_CARE_PROVIDER_SITE_OTHER): Payer: Self-pay | Admitting: *Deleted

## 2022-11-11 ENCOUNTER — Ambulatory Visit (HOSPITAL_COMMUNITY)
Admission: RE | Admit: 2022-11-11 | Discharge: 2022-11-11 | Disposition: A | Discharge status: Home / Self Care | Payer: BC Managed Care – PPO | Source: Ambulatory Visit

## 2022-11-11 DIAGNOSIS — Z1231 Encounter for screening mammogram for malignant neoplasm of breast: Secondary | ICD-10-CM | POA: Diagnosis present

## 2023-01-16 ENCOUNTER — Telehealth (INDEPENDENT_AMBULATORY_CARE_PROVIDER_SITE_OTHER): Payer: Self-pay | Admitting: *Deleted

## 2023-01-16 NOTE — Telephone Encounter (Signed)
Who is your primary care physician: Wille Glaser  Reasons for the colonoscopy: recall  Have you had a colonoscopy before?  Yes 2019  Do you have family history of colon cancer? Yes maternal uncles  Previous colonoscopy with polyps removed?   Do you have a history colorectal cancer?   no  Are you diabetic? If yes, Type 1 or Type 2?    Type 2  Do you have a prosthetic or mechanical heart valve? no  Do you have a pacemaker/defibrillator?   no  Have you had endocarditis/atrial fibrillation? no  Have you had joint replacement within the last 12 months?  no  Do you tend to be constipated or have to use laxatives? no  Do you have any history of drugs or alchohol?  no  Do you use supplemental oxygen?  no  Have you had a stroke or heart attack within the last 6 months? no  Do you take weight loss medication?   For female patients: have you had a hysterectomy?  yes                                     are you post menopausal?       yes                                            do you still have your menstrual cycle?       Do you take any blood-thinning medications such as: (aspirin, warfarin, Plavix, Aggrenox)  aspirin 81mg   If yes we need the name, milligram, dosage and who is prescribing doctor  Current Outpatient Medications on File Prior to Visit  Medication Sig Dispense Refill   ALPRAZolam (XANAX) 1 MG tablet Take 1 mg by mouth 2 (two) times daily.     aspirin EC 81 MG tablet Take 81 mg by mouth daily. Swallow whole.     Omega-3 Fatty Acids (OMEGA-3 FISH OIL PO) Take by mouth daily.     omeprazole (PRILOSEC OTC) 20 MG tablet Take 20 mg by mouth daily.     rosuvastatin (CRESTOR) 5 MG tablet Take 5 mg by mouth daily.     Tirzepatide [facility]) 2.5 MG/0.5ML SOPN once a week.     No current facility-administered medications on file prior to visit.    Allergies  Allergen Reactions   Bee Venom Hives   Statins     Muscle aches     Pharmacy: [facility] [address]

## 2023-01-19 NOTE — Telephone Encounter (Signed)
LMOVM to call back. Will need to hold mounjaro x 1 week

## 2023-01-23 NOTE — Telephone Encounter (Signed)
LMOVM to call back. Letter mailed to call and schedule procedure

## 2023-05-09 ENCOUNTER — Encounter (INDEPENDENT_AMBULATORY_CARE_PROVIDER_SITE_OTHER): Payer: Self-pay | Admitting: *Deleted
